# Patient Record
Sex: Male | Born: 1965 | Race: White | Hispanic: No | Marital: Married | Smoking: Current every day smoker
Health system: Southern US, Community
[De-identification: ages and names within clinical notes are randomized; demographics above are authoritative.]

## PROBLEM LIST (undated history)

## (undated) DIAGNOSIS — I10 Essential (primary) hypertension: Secondary | ICD-10-CM

## (undated) DIAGNOSIS — J40 Bronchitis, not specified as acute or chronic: Secondary | ICD-10-CM

## (undated) DIAGNOSIS — G4730 Sleep apnea, unspecified: Secondary | ICD-10-CM

## (undated) HISTORY — PX: CHOLECYSTECTOMY: SHX55

## (undated) HISTORY — PX: NASAL SINUS SURGERY: SHX719

---

## 2011-01-22 ENCOUNTER — Encounter: Payer: Self-pay | Admitting: *Deleted

## 2011-01-22 ENCOUNTER — Emergency Department
Admission: EM | Admit: 2011-01-22 | Discharge: 2011-01-22 | Disposition: A | Discharge status: Home / Self Care | Payer: BC Managed Care – PPO | Source: Home / Self Care | Attending: Family Medicine | Admitting: Family Medicine

## 2011-01-22 ENCOUNTER — Emergency Department
Admit: 2011-01-22 | Discharge: 2011-01-22 | Disposition: A | Discharge status: Home / Self Care | Payer: BC Managed Care – PPO | Attending: Family Medicine | Admitting: Family Medicine

## 2011-01-22 DIAGNOSIS — J209 Acute bronchitis, unspecified: Secondary | ICD-10-CM

## 2011-01-22 DIAGNOSIS — J189 Pneumonia, unspecified organism: Secondary | ICD-10-CM

## 2011-01-22 HISTORY — DX: Essential (primary) hypertension: I10

## 2011-01-22 LAB — POCT INFLUENZA A/B: Influenza B, POC: NEGATIVE

## 2011-01-22 LAB — POCT CBC W AUTO DIFF (K'VILLE URGENT CARE)

## 2011-01-22 MED ORDER — BENZONATATE 200 MG PO CAPS: 200.0000 mg | ORAL_CAPSULE | Freq: Every day | ORAL | Status: AC

## 2011-01-22 MED ORDER — CEFTRIAXONE SODIUM 1 G IJ SOLR
1.0000 g | Freq: Once | INTRAMUSCULAR | Status: AC
  Administered 2011-01-22: 1 g via INTRAMUSCULAR

## 2011-01-22 MED ORDER — LEVOFLOXACIN 500 MG PO TABS: 500.0000 mg | ORAL_TABLET | Freq: Every day | ORAL | Status: AC

## 2011-01-22 NOTE — ED Notes (Signed)
Pt c/o productive cough x 1-2 wks, he was treated by his PCP for bronchitis on 01/18/11 with a zpak. He also c/o fever, body aches, and HA x today. No OTC meds. He did not receive a flu vaccine.

## 2011-01-22 NOTE — ED Provider Notes (Signed)
History     CSN: [id number]  Arrival date & time 01/22/11  1210   First MD Initiated Contact with Patient 01/22/11 1349      Chief Complaint  Patient presents with  . Fever  . Cough  . Generalized Body Aches      HPI Comments: HPI : Patient developed a cold-like illness about two weeks ago and was treated by his PCP for bronchitis with a Z-pack and cough suppressant. Has now developed flu symptoms two days ago with fever to 101.6.   Complains of chills, sweats, myalgias, fatigue, headache. Symptoms are progressively worsening, despite trying OTC fever reducing medicine and rest and fluids.  His cough has become worse.  It is partly productive and worse at night.  Has decreased appetite, but tolerating some liquids by mouth.  He smokes, and has a history of pneumonia in the distant past.  He also has occasional bronchitis.  Review of Systems: Positive for fatigue, mild nasal congestion, mild sore throat, worsening cough. Negative for acute vision changes, stiff neck, focal weakness, syncope, seizures, respiratory distress, vomiting, diarrhea, GU symptoms.   Patient is a 45 y.o. male presenting with fever and cough. The history is provided by the patient.  Fever Primary symptoms of the febrile illness include fever, fatigue, headaches, cough, wheezing, shortness of breath, nausea and myalgias. Primary symptoms do not include visual change, abdominal pain, vomiting, diarrhea, dysuria, arthralgias or rash. The current episode started 2 days ago. This is a new problem.  The headache is not associated with visual change.  Cough Associated symptoms include headaches, myalgias, shortness of breath and wheezing.    Past Medical History  Diagnosis Date  . Hypertension     Past Surgical History  Procedure Date  . Cholecystectomy   . Nasal sinus surgery     Family History  Problem Relation Age of Onset  . Hypertension Mother   . Hypertension Father   . Heart failure Father   .  Cancer Father     kidney  . Hypertension Brother     History  Substance Use Topics  . Smoking status: Current Everyday Smoker -- 0.5 packs/day  . Smokeless tobacco: Not on file  . Alcohol Use: No      Review of Systems  Constitutional: Positive for fever and fatigue.  Respiratory: Positive for cough, shortness of breath and wheezing.   Gastrointestinal: Positive for nausea. Negative for vomiting, abdominal pain and diarrhea.  Genitourinary: Negative for dysuria.  Musculoskeletal: Positive for myalgias. Negative for arthralgias.  Skin: Negative for rash.  Neurological: Positive for headaches.    Allergies  Review of patient's allergies indicates no known allergies.  Home Medications   Current Outpatient Rx  Name Route Sig Dispense Refill  . METOPROLOL SUCCINATE ER 100 MG PO TB24 Oral Take 100 mg by mouth daily.      Marland Kitchen BENZONATATE 200 MG PO CAPS Oral Take 1 capsule (200 mg total) by mouth at bedtime. Take as needed for cough 12 capsule 0  . LEVOFLOXACIN 500 MG PO TABS Oral Take 1 tablet (500 mg total) by mouth daily. 7 tablet 0    BP 123/82  Pulse 88  Temp(Src) 102.2 F (39 C) (Oral)  Resp 18  Ht 6' (1.829 m)  Wt 238 lb 8 oz (108.183 kg)  BMI 32.35 kg/m2  SpO2 96%  Physical Exam Nursing notes and Vital Signs reviewed. Appearance:  Patient appears healthy, stated age, and in no acute distress.  Patient is obese (BMI  32.4)   Eyes:  Pupils are equal, round, and reactive to light and accomodation.  Extraocular movement is intact.  Conjunctivae are not inflamed  Ears:  Canals normal.  Tympanic membranes normal.  Nose:  Mildly congested turbinates.  No sinus tenderness.   Pharynx:  Normal Neck:  Supple.  No adenopathy Lungs:  Faint wheezes left posterior base.  Breath sounds are equal.  Heart:  Regular rate and rhythm without murmurs, rubs, or gallops.  Abdomen:  Nontender without masses or hepatosplenomegaly.  Bowel sounds are present.  No CVA or flank tenderness.    Extremities:  No edema.  No calf tenderness Skin:  No rash present.   ED Course  Procedures  none   Labs Reviewed  POCT INFLUENZA A/B Negative  POCT CBC W AUTO DIFF ([facility]):  CBC:  WBC 16.1; LY 11.4; MO 3.2; GR 85.4; Hgb 13.7    Dg Chest 2 View  01/22/2011  *RADIOLOGY REPORT*  Clinical Data: Cough.  Fever and congestion.  CHEST - 2 VIEW  Comparison: None.  Findings: Heart size is normal.  Mediastinal shadows are normal. There is mild central bronchial thickening but no infiltrate, collapse or effusion.  No significant bony finding.  IMPRESSION: Bronchitis.  No consolidation or collapse.  Original Report Authenticated By: Thomasenia Sales, M.D.     1. Acute bronchitis       MDM  Rocephin 1gm IM.  Tomorrow begin Levaquin 500mg  once daily for one week.  Tessalon at bedtime. Take Mucinex (guaifenesin) twice daily for congestion.  Increase fluid intake, rest. May use Afrin nasal spray (or generic oxymetazoline) twice daily for about 5 days.  Also recommend using saline nasal spray several times daily and/or saline nasal irrigation. Stop all antihistamines for now, and other non-prescription cough/cold preparations. Recommend flu shot when well. Follow-up with family doctor in about one week         Donna Christen, MD 01/22/11 1454

## 2012-02-13 ENCOUNTER — Emergency Department (INDEPENDENT_AMBULATORY_CARE_PROVIDER_SITE_OTHER)
Admission: EM | Admit: 2012-02-13 | Discharge: 2012-02-13 | Disposition: A | Discharge status: Home / Self Care | Payer: BC Managed Care – PPO | Source: Home / Self Care | Attending: Family Medicine | Admitting: Family Medicine

## 2012-02-13 DIAGNOSIS — J9801 Acute bronchospasm: Secondary | ICD-10-CM

## 2012-02-13 DIAGNOSIS — J069 Acute upper respiratory infection, unspecified: Secondary | ICD-10-CM

## 2012-02-13 MED ORDER — PREDNISONE 20 MG PO TABS: 20.0000 mg | ORAL_TABLET | Freq: Two times a day (BID) | ORAL | Status: DC

## 2012-02-13 MED ORDER — ALBUTEROL SULFATE HFA 108 (90 BASE) MCG/ACT IN AERS: 2.0000 | INHALATION_SPRAY | RESPIRATORY_TRACT | Status: DC | PRN

## 2012-02-13 MED ORDER — AZITHROMYCIN 250 MG PO TABS: ORAL_TABLET | ORAL | Status: DC

## 2012-02-13 MED ORDER — BENZONATATE 200 MG PO CAPS: 200.0000 mg | ORAL_CAPSULE | Freq: Every day | ORAL | Status: DC

## 2012-02-13 NOTE — ED Notes (Addendum)
Jeremy Melton complains of cough and wheezing for 3 days. He has congestion and feels tired. He is taking allegra with no relief. Denies fever, chills or sweat. No flu vaccine this season.

## 2012-02-13 NOTE — ED Provider Notes (Signed)
History     CSN: [id number]  Arrival date & time 02/13/12  1755   First MD Initiated Contact with Patient 02/13/12 1801      Chief Complaint  Patient presents with  . Cough    x 3 days  . Wheezing      HPI Comments: Patient complains of sudden onset of sinus congestion and sneezing four days ago.  Yesterday he developed cough, wheezing, shortness of breath with activity, and tightness in his anterior chest.  No fevers, chills, and sweats.  He continues to smoke one pack per day.  The history is provided by the patient.    Past Medical History  Diagnosis Date  . Hypertension     Past Surgical History  Procedure Date  . Cholecystectomy   . Nasal sinus surgery     Family History  Problem Relation Age of Onset  . Hypertension Mother   . Hypertension Father   . Heart failure Father   . Cancer Father     kidney  . Hypertension Brother     History  Substance Use Topics  . Smoking status: Current Every Day Smoker -- 1.0 packs/day for 20 years    Types: Cigarettes  . Smokeless tobacco: Not on file  . Alcohol Use: No      Review of Systems No sore throat + cough No pleuritic pain + wheezing + nasal congestion + post-nasal drainage No sinus pain/pressure No itchy/red eyes No earache No hemoptysis + SOB No fever/chills No nausea No vomiting No abdominal pain No diarrhea No urinary symptoms No skin rashes + fatigue No myalgias No headache Used OTC meds without relief  Allergies  Zyrtec  Home Medications   Current Outpatient Rx  Name  Route  Sig  Dispense  Refill  . METOPROLOL SUCCINATE ER 100 MG PO TB24   Oral   Take 100 mg by mouth daily.           . ALBUTEROL SULFATE HFA 108 (90 BASE) MCG/ACT IN AERS   Inhalation   Inhale 2 puffs into the lungs every 4 (four) hours as needed for wheezing.   1 Inhaler   0   . AZITHROMYCIN 250 MG PO TABS      Take 2 tabs today; then begin one tab once daily for 4 more days.   6 each   0   .  BENZONATATE 200 MG PO CAPS   Oral   Take 1 capsule (200 mg total) by mouth at bedtime. Take as needed for cough   12 capsule   0   . PREDNISONE 20 MG PO TABS   Oral   Take 1 tablet (20 mg total) by mouth 2 (two) times daily.   10 tablet   0     BP 145/93  Pulse 78  Temp 98.4 F (36.9 C) (Oral)  Resp 20  Ht 6' (1.829 m)  Wt 247 lb (112.038 kg)  BMI 33.50 kg/m2  SpO2 94%  Physical Exam Nursing notes and Vital Signs reviewed. Appearance:  Patient appears stated age, and in no acute distress.  Patient is obese (BMI 33.5) Eyes:  Pupils are equal, round, and reactive to light and accomodation.  Extraocular movement is intact.  Conjunctivae are not inflamed  Ears:  Canals normal.  Tympanic membranes normal.  Nose:  Mildly congested turbinates.  No sinus tenderness.   Pharynx:  Normal Neck:  Supple.  No adenopathy Lungs:   Faint diffuse wheezes; no rales or rhonchi.  Breath sounds are equal.  Heart:  Regular rate and rhythm without murmurs, rubs, or gallops.  Abdomen:  Nontender without masses or hepatosplenomegaly.  Bowel sounds are present.  No CVA or flank tenderness.  Extremities:  No edema.  No calf tenderness Skin:  No rash present.   ED Course  Procedures  none      1. Acute upper respiratory infections of unspecified site in a smoker  2. Bronchospasm, acute       MDM   Begin prednisone burst and Z-pack.  Rx for albuterol inhaler.  Prescription written for Benzonatate [facility]) to take at bedtime for night-time cough.  Take plain Mucinex (guaifenesin) twice daily for cough and congestion.  Increase fluid intake, rest. May use Afrin nasal spray (or generic oxymetazoline) twice daily for about 5 days.  Also recommend using saline nasal spray several times daily and saline nasal irrigation (AYR is a common brand) Stop all antihistamines for now, and other non-prescription cough/cold preparations. Follow-up with family doctor if not improving 7 to 10 days.          Lattie Haw, MD 02/17/12 [phone]

## 2013-01-27 ENCOUNTER — Emergency Department
Admission: EM | Admit: 2013-01-27 | Discharge: 2013-01-27 | Disposition: A | Discharge status: Home / Self Care | Payer: BC Managed Care – PPO | Source: Home / Self Care | Attending: Family Medicine | Admitting: Family Medicine

## 2013-01-27 ENCOUNTER — Encounter: Payer: Self-pay | Admitting: Emergency Medicine

## 2013-01-27 DIAGNOSIS — J9801 Acute bronchospasm: Secondary | ICD-10-CM

## 2013-01-27 DIAGNOSIS — J069 Acute upper respiratory infection, unspecified: Secondary | ICD-10-CM

## 2013-01-27 HISTORY — DX: Bronchitis, not specified as acute or chronic: J40

## 2013-01-27 MED ORDER — ALBUTEROL SULFATE HFA 108 (90 BASE) MCG/ACT IN AERS: 2.0000 | INHALATION_SPRAY | RESPIRATORY_TRACT | Status: DC | PRN

## 2013-01-27 MED ORDER — BENZONATATE 200 MG PO CAPS: 200.0000 mg | ORAL_CAPSULE | Freq: Every day | ORAL | Status: DC

## 2013-01-27 MED ORDER — PREDNISONE 20 MG PO TABS: 20.0000 mg | ORAL_TABLET | Freq: Two times a day (BID) | ORAL | Status: DC

## 2013-01-27 MED ORDER — AZITHROMYCIN 250 MG PO TABS: ORAL_TABLET | ORAL | Status: DC

## 2013-01-27 NOTE — ED Provider Notes (Signed)
CSN: [id number]     Arrival date & time 01/27/13  1540 History   First MD Initiated Contact with Patient 01/27/13 1634     Chief Complaint  Patient presents with  . Cough      HPI Comments: Patient complains of onset of sinus congestion, non-productive cough, wheezing, and shortness of breath for 3 days.  No fevers, chills, and sweats.  He continues to smoke.  The history is provided by the patient.    Past Medical History  Diagnosis Date  . Hypertension   . Bronchitis    Past Surgical History  Procedure Laterality Date  . Cholecystectomy    . Nasal sinus surgery     Family History  Problem Relation Age of Onset  . Hypertension Mother   . Hypertension Father   . Heart failure Father   . Cancer Father     kidney  . Hypertension Brother    History  Substance Use Topics  . Smoking status: Current Every Day Smoker -- 1.00 packs/day for 20 years    Types: Cigarettes  . Smokeless tobacco: Not on file  . Alcohol Use: No    Review of Systems + sore throat + cough No pleuritic pain + wheezing + nasal congestion + post-nasal drainage No sinus pain/pressure No itchy/red eyes No earache No hemoptysis + SOB No fever, + chills No nausea No vomiting No abdominal pain No diarrhea No urinary symptoms No skin rash + fatigue No myalgias No headache Used OTC meds without relief  Allergies  Allegra and Zyrtec  Home Medications   Current Outpatient Rx  Name  Route  Sig  Dispense  Refill  . albuterol (PROVENTIL HFA;VENTOLIN HFA) 108 (90 BASE) MCG/ACT inhaler   Inhalation   Inhale 2 puffs into the lungs every 4 (four) hours as needed for wheezing.   1 Inhaler   0   . azithromycin (ZITHROMAX Z-PAK) 250 MG tablet      Take 2 tabs today; then begin one tab once daily for 4 more days.   6 each   0   . benzonatate (TESSALON) 200 MG capsule   Oral   Take 1 capsule (200 mg total) by mouth at bedtime. Take as needed for cough   12 capsule   0   . metoprolol  (TOPROL-XL) 100 MG 24 hr tablet   Oral   Take 100 mg by mouth daily.           . predniSONE (DELTASONE) 20 MG tablet   Oral   Take 1 tablet (20 mg total) by mouth 2 (two) times daily.   10 tablet   0    BP 128/78  Pulse 83  Temp(Src) 98.3 F (36.8 C) (Oral)  Resp 16  Ht 6\' 1"  (1.854 m)  Wt 250 lb (113.399 kg)  BMI 32.99 kg/m2  SpO2 95% Physical Exam Nursing notes and Vital Signs reviewed. Appearance:  Patient appears stated age, and in no acute distress.  Patient is obese (BMI 33.0) Eyes:  Pupils are equal, round, and reactive to light and accomodation.  Extraocular movement is intact.  Conjunctivae are not inflamed  Ears:  Canals normal.  Tympanic membranes normal.  Nose:  Mildly congested turbinates.  No sinus tenderness.     Pharynx:  Normal Neck:  Supple.  Non-tender shotty posterior nodes are palpated bilaterally  Lungs:   Bibasilar wheezes and decreased breath sounds posteriorly.  Breath sounds are equal. No respiratory distress Heart:  Regular rate and rhythm without  murmurs, rubs, or gallops.  Abdomen:  Nontender without masses or hepatosplenomegaly.  Bowel sounds are present.  No CVA or flank tenderness.  Extremities:  No edema.  No calf tenderness Skin:  No rash present.   ED Course  Procedures  none          MDM   1. Acute upper respiratory infections of unspecified site; suspect early viral URI.  History of recurring bronchitis in a smoker  2. Bronchospasm    Begin Z-pack, prednisone burst.  Continue albuterol MDI as needed.  Prescription written for Benzonatate [facility](Tessalon) to take at bedtime for night-time cough.  Take plain Mucinex (1200 mg guaifenesin) twice daily for cough and congestion.  Increase fluid intake, rest. May use Afrin nasal spray (or generic oxymetazoline) twice daily for about 5 days.  Also recommend using saline nasal spray several times daily and saline nasal irrigation (AYR is a common brand) Try warm salt water gargles for sore  throat.  Stop all antihistamines for now, and other non-prescription cough/cold preparations.     Follow-up with family doctor if not improving 7 to 10 days.    Lattie HawStephen A Tanaka Gillen, MD 01/28/13 1005

## 2013-01-27 NOTE — Discharge Instructions (Signed)
Take plain Mucinex (1200 mg guaifenesin) twice daily for cough and congestion.  Increase fluid intake, rest. °May use Afrin nasal spray (or generic oxymetazoline) twice daily for about 5 days.  Also recommend using saline nasal spray several times daily and saline nasal irrigation (AYR is a common brand) °Try warm salt water gargles for sore throat.  °Stop all antihistamines for now, and other non-prescription cough/cold preparations. °  °Follow-up with family doctor if not improving 7 to 10 days.  °

## 2013-01-27 NOTE — ED Notes (Signed)
Has had cough x 3 days and has hx of bronchitis so wanted to be checked.

## 2013-04-18 DIAGNOSIS — I10 Essential (primary) hypertension: Secondary | ICD-10-CM | POA: Insufficient documentation

## 2013-07-01 ENCOUNTER — Emergency Department (INDEPENDENT_AMBULATORY_CARE_PROVIDER_SITE_OTHER): Payer: BC Managed Care – PPO

## 2013-07-01 ENCOUNTER — Emergency Department
Admission: EM | Admit: 2013-07-01 | Discharge: 2013-07-01 | Disposition: A | Discharge status: Home / Self Care | Payer: BC Managed Care – PPO | Source: Home / Self Care | Attending: Family Medicine | Admitting: Family Medicine

## 2013-07-01 ENCOUNTER — Encounter: Payer: Self-pay | Admitting: Emergency Medicine

## 2013-07-01 DIAGNOSIS — S92919A Unspecified fracture of unspecified toe(s), initial encounter for closed fracture: Secondary | ICD-10-CM

## 2013-07-01 DIAGNOSIS — M7730 Calcaneal spur, unspecified foot: Secondary | ICD-10-CM

## 2013-07-01 DIAGNOSIS — Z23 Encounter for immunization: Secondary | ICD-10-CM

## 2013-07-01 DIAGNOSIS — W010XXA Fall on same level from slipping, tripping and stumbling without subsequent striking against object, initial encounter: Secondary | ICD-10-CM

## 2013-07-01 DIAGNOSIS — Z716 Tobacco abuse counseling: Secondary | ICD-10-CM

## 2013-07-01 DIAGNOSIS — S92403A Displaced unspecified fracture of unspecified great toe, initial encounter for closed fracture: Secondary | ICD-10-CM

## 2013-07-01 MED ORDER — TRAMADOL-ACETAMINOPHEN 37.5-325 MG PO TABS: 1.0000 | ORAL_TABLET | Freq: Four times a day (QID) | ORAL | Status: DC | PRN

## 2013-07-01 MED ORDER — TETANUS-DIPHTH-ACELL PERTUSSIS 5-2.5-18.5 LF-MCG/0.5 IM SUSP
0.5000 mL | Freq: Once | INTRAMUSCULAR | Status: AC
  Administered 2013-07-01: 0.5 mL via INTRAMUSCULAR

## 2013-07-01 NOTE — ED Notes (Signed)
Slipped  off step and L great toe went under his foot.  Happened a couple of hrs. Ago. Sustained a bruise and sl. Abrasion on top of toe.  Distal joint painful to move. No Tetanus shot in > 10 yrs.

## 2013-07-01 NOTE — ED Provider Notes (Signed)
CSN: [id number]     Arrival date & time 07/01/13  1444 History   First MD Initiated Contact with Patient 07/01/13 1445     Chief Complaint  Patient presents with  . Toe Injury    HPI  L great toe pain x1 day. Pt was going up the stairs. Fell and twisted L toe. Has had pain, swelling since this point. Able to ambulate.   Past Medical History  Diagnosis Date  . Hypertension   . Bronchitis    Past Surgical History  Procedure Laterality Date  . Cholecystectomy    . Nasal sinus surgery     Family History  Problem Relation Age of Onset  . Hypertension Mother   . Hypertension Father   . Heart failure Father   . Cancer Father     kidney  . Hypertension Brother    History  Substance Use Topics  . Smoking status: Current Every Day Smoker -- 1.00 packs/day for 20 years    Types: Cigarettes  . Smokeless tobacco: Never Used  . Alcohol Use: No    Review of Systems  All other systems reviewed and are negative.   Allergies  Allegra and Zyrtec  Home Medications   Prior to Admission medications   Medication Sig Start Date End Date Taking? Authorizing Provider  albuterol (PROVENTIL HFA;VENTOLIN HFA) 108 (90 BASE) MCG/ACT inhaler Inhale 2 puffs into the lungs every 4 (four) hours as needed for wheezing. 01/27/13 01/27/14  Lattie HawStephen A Beese, MD  azithromycin (ZITHROMAX Z-PAK) 250 MG tablet Take 2 tabs today; then begin one tab once daily for 4 more days. 01/27/13   Lattie HawStephen A Beese, MD  benzonatate (TESSALON) 200 MG capsule Take 1 capsule (200 mg total) by mouth at bedtime. Take as needed for cough 01/27/13   Lattie HawStephen A Beese, MD  metoprolol (TOPROL-XL) 100 MG 24 hr tablet Take 100 mg by mouth daily.      Historical Provider, MD  predniSONE (DELTASONE) 20 MG tablet Take 1 tablet (20 mg total) by mouth 2 (two) times daily. 01/27/13   Lattie HawStephen A Beese, MD   BP 127/84  Pulse 70  Temp(Src) 98.1 F (36.7 C) (Oral)  Ht 6' (1.829 m)  Wt 250 lb (113.399 kg)  BMI 33.90 kg/m2  SpO2 97% Physical Exam    Constitutional: He is oriented to person, place, and time. He appears well-developed and well-nourished.  HENT:  Head: Normocephalic and atraumatic.  Eyes: Conjunctivae are normal. Pupils are equal, round, and reactive to light.  Neck: Normal range of motion. Neck supple.  Cardiovascular: Normal rate and regular rhythm.   Pulmonary/Chest: Effort normal and breath sounds normal.  Abdominal: Soft.  Musculoskeletal: Normal range of motion.       Feet:  Neurological: He is alert and oriented to person, place, and time.    ED Course  Procedures (including critical care time) Labs Review Labs Reviewed - No data to display  Imaging Review Dg Foot Complete Left  07/01/2013   CLINICAL DATA:  Fall.  Left foot pain.  Great toe pain.  EXAM: LEFT FOOT - COMPLETE 3+ VIEW  COMPARISON:  None.  FINDINGS: Nondisplaced fracture is seen involving the distal phalanx of the great toe. No evidence of dislocation. No other fractures are identified. Small plantar and dorsal calcaneal bone spurs noted.  IMPRESSION: Nondisplaced fracture of the distal phalanx of the great toe   Electronically Signed   By: Myles RosenthalJohn  Stahl M.D.   On: 07/01/2013 15:35  MDM   1. Fractured great toe    Will place on post op shoe and buddy tape  tdap given  Ultracet for pain  Discussed MSK red flags  Follow up with sports medicine.     The patient and/or caregiver has been counseled thoroughly with regard to treatment plan and/or medications prescribed including dosage, schedule, interactions, rationale for use, and possible side effects and they verbalize understanding. Diagnoses and expected course of recovery discussed and will return if not improved as expected or if the condition worsens. Patient and/or caregiver verbalized understanding.           Doree Albee, MD 07/01/13 [phone]

## 2013-07-04 ENCOUNTER — Telehealth: Payer: Self-pay | Admitting: *Deleted

## 2013-07-10 ENCOUNTER — Ambulatory Visit (INDEPENDENT_AMBULATORY_CARE_PROVIDER_SITE_OTHER): Payer: BC Managed Care – PPO | Admitting: Sports Medicine

## 2013-07-10 ENCOUNTER — Encounter: Payer: Self-pay | Admitting: Sports Medicine

## 2013-07-10 VITALS — BP 134/89 | HR 72 | Ht 73.0 in | Wt 248.0 lb

## 2013-07-10 DIAGNOSIS — S92919A Unspecified fracture of unspecified toe(s), initial encounter for closed fracture: Secondary | ICD-10-CM

## 2013-07-10 DIAGNOSIS — S92402A Displaced unspecified fracture of left great toe, initial encounter for closed fracture: Secondary | ICD-10-CM | POA: Insufficient documentation

## 2013-07-10 MED ORDER — HYDROCODONE-ACETAMINOPHEN 5-325 MG PO TABS: 1.0000 | ORAL_TABLET | Freq: Three times a day (TID) | ORAL | Status: DC | PRN

## 2013-07-10 NOTE — Progress Notes (Signed)
   Subjective:    I'm seeing this patient as a consultation for:  Dr. Alvester MorinNewton  CC: Toe fracture  HPI: This pleasant 48 year-old male slipped and fell on his left great toe, he had immediate pain, swelling, bruising, was seen in urgent care where x-rays showed a fracture, transverse to the base of the distal phalanx. Pain is moderate, persistent. He is in a postop boot.  Past medical history, Surgical history, Family history not pertinant except as noted below, Social history, Allergies, and medications have been entered into the medical record, reviewed, and no changes needed.   Review of Systems: No headache, visual changes, nausea, vomiting, diarrhea, constipation, dizziness, abdominal pain, skin rash, fevers, chills, night sweats, weight loss, swollen lymph nodes, body aches, joint swelling, muscle aches, chest pain, shortness of breath, mood changes, visual or auditory hallucinations.   Objective:   General: Well Developed, well nourished, and in no acute distress.  Neuro/Psych: Alert and oriented x3, extra-ocular muscles intact, able to move all 4 extremities, sensation grossly intact. Skin: Warm and dry, no rashes noted.  Respiratory: Not using accessory muscles, speaking in full sentences, trachea midline.  Cardiovascular: Pulses palpable, no extremity edema. Abdomen: Does not appear distended. Left foot: Swollen, bruised, tender to palpation over the distal first phalanx. Neurovascularly intact distally.  X-rays reviewed and short transverse fracture of the base of the first distal phalanx  Impression and Recommendations:   This case required medical decision making of moderate complexity.

## 2013-07-10 NOTE — Assessment & Plan Note (Signed)
Closed fracture, transverse to the base of the distal phalanx. Buddy tape, continue postop shoe, hydrocodone for pain, return in 3 weeks, x-ray before visit.  I billed a fracture code for this visit, all subsequent visits for this complaint will be "post-op checks" in the global period.

## 2013-07-31 ENCOUNTER — Encounter: Payer: Self-pay | Admitting: Sports Medicine

## 2013-07-31 ENCOUNTER — Ambulatory Visit (INDEPENDENT_AMBULATORY_CARE_PROVIDER_SITE_OTHER): Payer: BC Managed Care – PPO | Admitting: Sports Medicine

## 2013-07-31 ENCOUNTER — Ambulatory Visit (INDEPENDENT_AMBULATORY_CARE_PROVIDER_SITE_OTHER): Payer: BC Managed Care – PPO

## 2013-07-31 VITALS — BP 122/78 | HR 70 | Ht 73.0 in | Wt 256.0 lb

## 2013-07-31 DIAGNOSIS — S92402D Displaced unspecified fracture of left great toe, subsequent encounter for fracture with routine healing: Secondary | ICD-10-CM

## 2013-07-31 DIAGNOSIS — IMO0001 Reserved for inherently not codable concepts without codable children: Secondary | ICD-10-CM

## 2013-07-31 DIAGNOSIS — S92402A Displaced unspecified fracture of left great toe, initial encounter for closed fracture: Secondary | ICD-10-CM

## 2013-07-31 NOTE — Progress Notes (Signed)
  Subjective: 3 weeks post transverse fracture of the left great distal phalanx, improving significantly.   Objective: General: Well-developed, well-nourished, and in no acute distress. Left Foot: No visible erythema or swelling. Range of motion is full in all directions. Strength is 5/5 in all directions. No hallux valgus. No pes cavus or pes planus. No abnormal callus noted. No pain over the navicular prominence, or base of fifth metatarsal. No tenderness to palpation of the calcaneal insertion of plantar fascia. No pain at the Achilles insertion. No pain over the calcaneal bursa. No pain of the retrocalcaneal bursa. Only minimal tenderness to palpation over the distal phalanx. No hallux rigidus or limitus. No tenderness palpation over interphalangeal joints. No pain with compression of the metatarsal heads. Neurovascularly intact distally.  X-rays reviewed and show healing of the transverse fracture.  Assessment/plan:

## 2013-07-31 NOTE — Assessment & Plan Note (Signed)
Transition into a regular shoe, return in 3 weeks, we will likely turn him loose after that.

## 2013-08-13 ENCOUNTER — Emergency Department
Admission: EM | Admit: 2013-08-13 | Discharge: 2013-08-13 | Disposition: A | Discharge status: Home / Self Care | Payer: BC Managed Care – PPO | Source: Home / Self Care

## 2013-08-13 ENCOUNTER — Encounter: Payer: Self-pay | Admitting: Emergency Medicine

## 2013-08-13 DIAGNOSIS — J209 Acute bronchitis, unspecified: Secondary | ICD-10-CM

## 2013-08-13 DIAGNOSIS — J0100 Acute maxillary sinusitis, unspecified: Secondary | ICD-10-CM

## 2013-08-13 MED ORDER — AZITHROMYCIN 250 MG PO TABS: ORAL_TABLET | ORAL | Status: DC

## 2013-08-13 MED ORDER — HYDROCODONE-HOMATROPINE 5-1.5 MG/5ML PO SYRP: ORAL_SOLUTION | ORAL | Status: DC

## 2013-08-13 NOTE — ED Notes (Signed)
Dry cough, sinus drainage x 1 month

## 2013-08-13 NOTE — Discharge Instructions (Signed)
STart zpak.  Hycodan for cough as needed at bedtime.    Acute Bronchitis Bronchitis is inflammation of the airways that extend from the windpipe into the lungs (bronchi). The inflammation often causes mucus to develop. This leads to a cough, which is the most common symptom of bronchitis.  In acute bronchitis, the condition usually develops suddenly and goes away over time, usually in a couple weeks. Smoking, allergies, and asthma can make bronchitis worse. Repeated episodes of bronchitis may cause further lung problems.  CAUSES Acute bronchitis is most often caused by the same virus that causes a cold. The virus can spread from person to person (contagious).  SIGNS AND SYMPTOMS   Cough.   Fever.   Coughing up mucus.   Body aches.   Chest congestion.   Chills.   Shortness of breath.   Sore throat.  DIAGNOSIS  Acute bronchitis is usually diagnosed through a physical exam. Tests, such as chest X-rays, are sometimes done to rule out other conditions.  TREATMENT  Acute bronchitis usually goes away in a couple weeks. Often times, no medical treatment is necessary. Medicines are sometimes given for relief of fever or cough. Antibiotics are usually not needed but may be prescribed in certain situations. In some cases, an inhaler may be recommended to help reduce shortness of breath and control the cough. A cool mist vaporizer may also be used to help thin bronchial secretions and make it easier to clear the chest.  HOME CARE INSTRUCTIONS  Get plenty of rest.   Drink enough fluids to keep your urine clear or pale yellow (unless you have a medical condition that requires fluid restriction). Increasing fluids may help thin your secretions and will prevent dehydration.   Only take over-the-counter or prescription medicines as directed by your health care provider.   Avoid smoking and secondhand smoke. Exposure to cigarette smoke or irritating chemicals will make bronchitis  worse. If you are a smoker, consider using nicotine gum or skin patches to help control withdrawal symptoms. Quitting smoking will help your lungs heal faster.   Reduce the chances of another bout of acute bronchitis by washing your hands frequently, avoiding people with cold symptoms, and trying not to touch your hands to your mouth, nose, or eyes.   Follow up with your health care provider as directed.  SEEK MEDICAL CARE IF: Your symptoms do not improve after 1 week of treatment.  SEEK IMMEDIATE MEDICAL CARE IF:  You develop an increased fever or chills.   You have chest pain.   You have severe shortness of breath.  You have bloody sputum.   You develop dehydration.  You develop fainting.  You develop repeated vomiting.  You develop a severe headache. MAKE SURE YOU:   Understand these instructions.  Will watch your condition.  Will get help right away if you are not doing well or get worse. Document Released: 02/19/2004 Document Revised: 09/13/2012 Document Reviewed: 07/04/2012 [facility] Patient Information 2015 [place], [place]. This information is not intended to replace advice given to you by your health care provider. Make sure you discuss any questions you have with your health care provider. Sinusitis Sinusitis is redness, soreness, and swelling (inflammation) of the paranasal sinuses. Paranasal sinuses are air pockets within the bones of your face (beneath the eyes, the middle of the forehead, or above the eyes). In healthy paranasal sinuses, mucus is able to drain out, and air is able to circulate through them by way of your nose. However, when your paranasal  sinuses are inflamed, mucus and air can become trapped. This can allow bacteria and other germs to grow and cause infection. Sinusitis can develop quickly and last only a short time (acute) or continue over a long period (chronic). Sinusitis that lasts for more than 12 weeks is considered chronic.  CAUSES    Causes of sinusitis include:  Allergies.  Structural abnormalities, such as displacement of the cartilage that separates your nostrils (deviated septum), which can decrease the air flow through your nose and sinuses and affect sinus drainage.  Functional abnormalities, such as when the small hairs (cilia) that line your sinuses and help remove mucus do not work properly or are not present. SYMPTOMS  Symptoms of acute and chronic sinusitis are the same. The primary symptoms are pain and pressure around the affected sinuses. Other symptoms include:  Upper toothache.  Earache.  Headache.  Bad breath.  Decreased sense of smell and taste.  A cough, which worsens when you are lying flat.  Fatigue.  Fever.  Thick drainage from your nose, which often is green and may contain pus (purulent).  Swelling and warmth over the affected sinuses. DIAGNOSIS  Your caregiver will perform a physical exam. During the exam, your caregiver may:  Look in your nose for signs of abnormal growths in your nostrils (nasal polyps).  Tap over the affected sinus to check for signs of infection.  View the inside of your sinuses (endoscopy) with a special imaging device with a light attached (endoscope), which is inserted into your sinuses. If your caregiver suspects that you have chronic sinusitis, one or more of the following tests may be recommended:  Allergy tests.  Nasal culture--A sample of mucus is taken from your nose and sent to a lab and screened for bacteria.  Nasal cytology--A sample of mucus is taken from your nose and examined by your caregiver to determine if your sinusitis is related to an allergy. TREATMENT  Most cases of acute sinusitis are related to a viral infection and will resolve on their own within 10 days. Sometimes medicines are prescribed to help relieve symptoms (pain medicine, decongestants, nasal steroid sprays, or saline sprays).  However, for sinusitis related to a  bacterial infection, your caregiver will prescribe antibiotic medicines. These are medicines that will help kill the bacteria causing the infection.  Rarely, sinusitis is caused by a fungal infection. In theses cases, your caregiver will prescribe antifungal medicine. For some cases of chronic sinusitis, surgery is needed. Generally, these are cases in which sinusitis recurs more than 3 times per year, despite other treatments. HOME CARE INSTRUCTIONS   Drink plenty of water. Water helps thin the mucus so your sinuses can drain more easily.  Use a humidifier.  Inhale steam 3 to 4 times a day (for example, sit in the bathroom with the shower running).  Apply a warm, moist washcloth to your face 3 to 4 times a day, or as directed by your caregiver.  Use saline nasal sprays to help moisten and clean your sinuses.  Take over-the-counter or prescription medicines for pain, discomfort, or fever only as directed by your caregiver. SEEK IMMEDIATE MEDICAL CARE IF:  You have increasing pain or severe headaches.  You have nausea, vomiting, or drowsiness.  You have swelling around your face.  You have vision problems.  You have a stiff neck.  You have difficulty breathing. MAKE SURE YOU:   Understand these instructions.  Will watch your condition.  Will get help right away if you are  not doing well or get worse. Document Released: 01/11/2005 Document Revised: 04/05/2011 Document Reviewed: 01/26/2011 [facility] Patient Information 2015 [place]. This information is not intended to replace advice given to you by your health care provider. Make sure you discuss any questions you have with your health care provider.

## 2013-08-13 NOTE — ED Provider Notes (Signed)
CSN: [id number]     Arrival date & time 08/13/13  1359 History   None    Chief Complaint  Patient presents with  . Cough   (Consider location/radiation/quality/duration/timing/severity/associated sxs/prior Treatment) HPI Pt is a 48 yo male who presents to the clinic with one month of dry and productive cough, sinus pressure and drainage. Previous smoker but quit a few months ago. Denies any fever, chills, n/v/d. Cough is worse at night. Not tried anything OtC.   Past Medical History  Diagnosis Date  . Hypertension   . Bronchitis    Past Surgical History  Procedure Laterality Date  . Cholecystectomy    . Nasal sinus surgery     Family History  Problem Relation Age of Onset  . Hypertension Mother   . Hypertension Father   . Heart failure Father   . Cancer Father     kidney  . Hypertension Brother    History  Substance Use Topics  . Smoking status: Current Every Day Smoker -- 1.00 packs/day for 20 years    Types: Cigarettes  . Smokeless tobacco: Never Used  . Alcohol Use: No    Review of Systems  All other systems reviewed and are negative.   Allergies  Allegra and Zyrtec  Home Medications   Prior to Admission medications   Medication Sig Start Date End Date Taking? Authorizing Provider  albuterol (PROVENTIL HFA;VENTOLIN HFA) 108 (90 BASE) MCG/ACT inhaler Inhale 2 puffs into the lungs every 4 (four) hours as needed for wheezing. 01/27/13 01/27/14  Lattie Haw, MD  azithromycin (ZITHROMAX) 250 MG tablet Take 2 tablets now and then one tablet for 4 days. 08/13/13   Krayton Wortley L Vernon Maish, PA-C  HYDROcodone-acetaminophen (NORCO/VICODIN) 5-325 MG per tablet Take 1 tablet by mouth every 8 (eight) hours as needed for moderate pain. 07/10/13   Monica Becton, MD  HYDROcodone-homatropine [facility]) 5-1.5 MG/5ML syrup Take 5mL every 12 hours and at bedtime as needed for cough. 08/13/13   Goro Wenrick L Lydiann Bonifas, PA-C  metoprolol (TOPROL-XL) 100 MG 24 hr tablet Take 100 mg by mouth daily.       Historical Provider, MD  traMADol-acetaminophen (ULTRACET) 37.5-325 MG per tablet Take 1 tablet by mouth every 6 (six) hours as needed. 07/01/13   Doree Albee, MD   BP 129/86  Pulse 68  Temp(Src) 98.3 F (36.8 C) (Oral)  Ht 6' (1.829 m)  Wt 257 lb (116.574 kg)  BMI 34.85 kg/m2  SpO2 96% Physical Exam  Constitutional: He is oriented to person, place, and time. He appears well-developed and well-nourished.  HENT:  Head: Normocephalic and atraumatic.  Right Ear: External ear normal.  Left Ear: External ear normal.  TM's clear bilaterally.  Maxillary sinus tenderness to palpation bilaterally.  Nasal turbinates red and swollen bilaterally.   Eyes: Conjunctivae are normal. Right eye exhibits no discharge. Left eye exhibits no discharge.  Neck: Normal range of motion. Neck supple.  Cardiovascular: Normal rate, regular rhythm and normal heart sounds.   Pulmonary/Chest: Effort normal and breath sounds normal. He has no wheezes.  Productive cough with deep breaths. Coarse breath sounds.   Lymphadenopathy:    He has no cervical adenopathy.  Neurological: He is alert and oriented to person, place, and time.  Skin: Skin is dry.  Psychiatric: He has a normal mood and affect. His behavior is normal.    ED Course  Procedures (including critical care time) Labs Review Labs Reviewed - No data to display  Imaging Review No results found.  MDM   1. Acute bronchitis, unspecified organism   2. Acute maxillary sinusitis, recurrence not specified    Treated with zpak and hycodan for cough.  Discussed symptomatic treatment and gave HO.  Consider flonase for nasal congestion 2 sprays each nostril daily.  Encouraged pt not to start back smoking.  Call if symptoms changing or not improving in next 3 days.     Jomarie LongsJade L Keiffer Piper, PA-C 08/13/13 1457

## 2013-08-14 NOTE — ED Provider Notes (Signed)
Agree with exam, assessment, and plan.   Lattie HawStephen A Beese, MD 08/14/13 [phone]

## 2013-08-21 ENCOUNTER — Ambulatory Visit (INDEPENDENT_AMBULATORY_CARE_PROVIDER_SITE_OTHER): Payer: BC Managed Care – PPO

## 2013-08-21 ENCOUNTER — Encounter: Payer: Self-pay | Admitting: Sports Medicine

## 2013-08-21 ENCOUNTER — Ambulatory Visit (INDEPENDENT_AMBULATORY_CARE_PROVIDER_SITE_OTHER): Payer: BC Managed Care – PPO | Admitting: Sports Medicine

## 2013-08-21 VITALS — BP 138/88 | HR 82 | Ht 73.0 in | Wt 260.0 lb

## 2013-08-21 DIAGNOSIS — M79609 Pain in unspecified limb: Secondary | ICD-10-CM

## 2013-08-21 DIAGNOSIS — M79671 Pain in right foot: Secondary | ICD-10-CM

## 2013-08-21 DIAGNOSIS — M7730 Calcaneal spur, unspecified foot: Secondary | ICD-10-CM

## 2013-08-21 DIAGNOSIS — S92402D Displaced unspecified fracture of left great toe, subsequent encounter for fracture with routine healing: Secondary | ICD-10-CM

## 2013-08-21 NOTE — Assessment & Plan Note (Signed)
This likely represents strain of the plantar fascia in the mid arch, he does have pes cavus. Considering an old fracture in the distant past we are going to obtain x-rays to further evaluate for any concurrent degenerative changes. Return to see me for orthotics as needed.

## 2013-08-21 NOTE — Assessment & Plan Note (Signed)
Clinically resolved, return as needed. 

## 2013-08-21 NOTE — Progress Notes (Signed)
  Subjective:    CC: Followup  HPI: Left great toe fracture: Clinically resolved after 7 weeks.  Right arch pain: Moderate, persistent, localized in the right arch, worse with weightbearing, has never had orthotics, doesn't use any NSAIDs. No recent trauma.  Past medical history, Surgical history, Family history not pertinant except as noted below, Social history, Allergies, and medications have been entered into the medical record, reviewed, and no changes needed.   Review of Systems: No fevers, chills, night sweats, weight loss, chest pain, or shortness of breath.   Objective:    General: Well Developed, well nourished, and in no acute distress.  Neuro: Alert and oriented x3, extra-ocular muscles intact, sensation grossly intact.  HEENT: Normocephalic, atraumatic, pupils equal round reactive to light, neck supple, no masses, no lymphadenopathy, thyroid nonpalpable.  Skin: Warm and dry, no rashes. Cardiac: Regular rate and rhythm, no murmurs rubs or gallops, no lower extremity edema.  Respiratory: Clear to auscultation bilaterally. Not using accessory muscles, speaking in full sentences. Right Foot: No visible erythema or swelling. Range of motion is full in all directions. Strength is 5/5 in all directions. No hallux valgus. Bilateral pes cavus No abnormal callus noted. No pain over the navicular prominence, or base of fifth metatarsal. No tenderness to palpation of the calcaneal insertion of plantar fascia. No pain at the Achilles insertion. No pain over the calcaneal bursa. No pain of the retrocalcaneal bursa. No tenderness to palpation over the tarsals, metatarsals, or phalanges. No hallux rigidus or limitus. No tenderness palpation over interphalangeal joints. No pain with compression of the metatarsal heads. Neurovascularly intact distally. Left Foot: No visible erythema or swelling. Range of motion is full in all directions. Strength is 5/5 in all directions. No  hallux valgus. No pes cavus or pes planus. No abnormal callus noted. No pain over the navicular prominence, or base of fifth metatarsal. No tenderness to palpation of the calcaneal insertion of plantar fascia. No pain at the Achilles insertion. No pain over the calcaneal bursa. No pain of the retrocalcaneal bursa. No tenderness to palpation over the tarsals, metatarsals, or phalanges. No tenderness to palpation over the fracture line No hallux rigidus or limitus. No tenderness palpation over interphalangeal joints. No pain with compression of the metatarsal heads. Neurovascularly intact distally.  Impression and Recommendations:

## 2013-09-06 DIAGNOSIS — G4730 Sleep apnea, unspecified: Secondary | ICD-10-CM | POA: Insufficient documentation

## 2013-11-16 ENCOUNTER — Ambulatory Visit (INDEPENDENT_AMBULATORY_CARE_PROVIDER_SITE_OTHER): Payer: BC Managed Care – PPO | Admitting: Sports Medicine

## 2013-11-16 ENCOUNTER — Encounter: Payer: Self-pay | Admitting: Sports Medicine

## 2013-11-16 VITALS — BP 119/77 | HR 75 | Ht 73.0 in | Wt 264.0 lb

## 2013-11-16 DIAGNOSIS — M79671 Pain in right foot: Secondary | ICD-10-CM

## 2013-11-16 NOTE — Addendum Note (Signed)
Addended by: Monica BectonHEKKEKANDAM, THOMAS J on: 11/16/2013 03:32 PM   Modules accepted: Level of Service

## 2013-11-16 NOTE — Progress Notes (Signed)

## 2013-11-16 NOTE — Assessment & Plan Note (Signed)
With bilateral pes cavus. Custom orthotics as above. Return in one month.

## 2013-12-14 ENCOUNTER — Ambulatory Visit (INDEPENDENT_AMBULATORY_CARE_PROVIDER_SITE_OTHER): Payer: BC Managed Care – PPO | Admitting: Sports Medicine

## 2013-12-14 ENCOUNTER — Encounter: Payer: Self-pay | Admitting: Sports Medicine

## 2013-12-14 VITALS — BP 132/88 | HR 79 | Ht 73.0 in | Wt 265.0 lb

## 2013-12-14 DIAGNOSIS — M79671 Pain in right foot: Secondary | ICD-10-CM | POA: Diagnosis not present

## 2013-12-14 NOTE — Assessment & Plan Note (Signed)
Symptoms have completely resolved with custom orthotics. Return as needed.

## 2013-12-14 NOTE — Progress Notes (Signed)
  Subjective:    CC: Follow-up  HPI: Heart pain: Resolved with custom orthotics.  Past medical history, Surgical history, Family history not pertinant except as noted below, Social history, Allergies, and medications have been entered into the medical record, reviewed, and no changes needed.   Review of Systems: No fevers, chills, night sweats, weight loss, chest pain, or shortness of breath.   Objective:    General: Well Developed, well nourished, and in no acute distress.  Neuro: Alert and oriented x3, extra-ocular muscles intact, sensation grossly intact.  HEENT: Normocephalic, atraumatic, pupils equal round reactive to light, neck supple, no masses, no lymphadenopathy, thyroid nonpalpable.  Skin: Warm and dry, no rashes. Cardiac: Regular rate and rhythm, no murmurs rubs or gallops, no lower extremity edema.  Respiratory: Clear to auscultation bilaterally. Not using accessory muscles, speaking in full sentences. Right Foot: No visible erythema or swelling. Range of motion is full in all directions. Strength is 5/5 in all directions. No hallux valgus. Bilateral pes cavus No abnormal callus noted. No pain over the navicular prominence, or base of fifth metatarsal. No tenderness to palpation of the calcaneal insertion of plantar fascia. No pain at the Achilles insertion. No pain over the calcaneal bursa. No pain of the retrocalcaneal bursa. No tenderness to palpation over the tarsals, metatarsals, or phalanges. No hallux rigidus or limitus. No tenderness palpation over interphalangeal joints. No pain with compression of the metatarsal heads. Neurovascularly intact distally. Left Foot: No visible erythema or swelling. Range of motion is full in all directions. Strength is 5/5 in all directions. No hallux valgus. No pes cavus or pes planus. No abnormal callus noted. No pain over the navicular prominence, or base of fifth metatarsal. No tenderness to palpation of the  calcaneal insertion of plantar fascia. No pain at the Achilles insertion. No pain over the calcaneal bursa. No pain of the retrocalcaneal bursa. No tenderness to palpation over the tarsals, metatarsals, or phalanges. No tenderness to palpation over the fracture line No hallux rigidus or limitus. No tenderness palpation over interphalangeal joints. No pain with compression of the metatarsal heads. Neurovascularly intact distally.  Impression and Recommendations:

## 2014-11-20 IMAGING — CR DG FOOT COMPLETE 3+V*L*
3 series · 3 of 3 positions shown · non-contrast
Comparison: None.

CLINICAL DATA: Fall.  Left foot pain.  Great toe pain.

EXAM:
LEFT FOOT - COMPLETE 3+ VIEW

[view not recorded (1 of 3)]
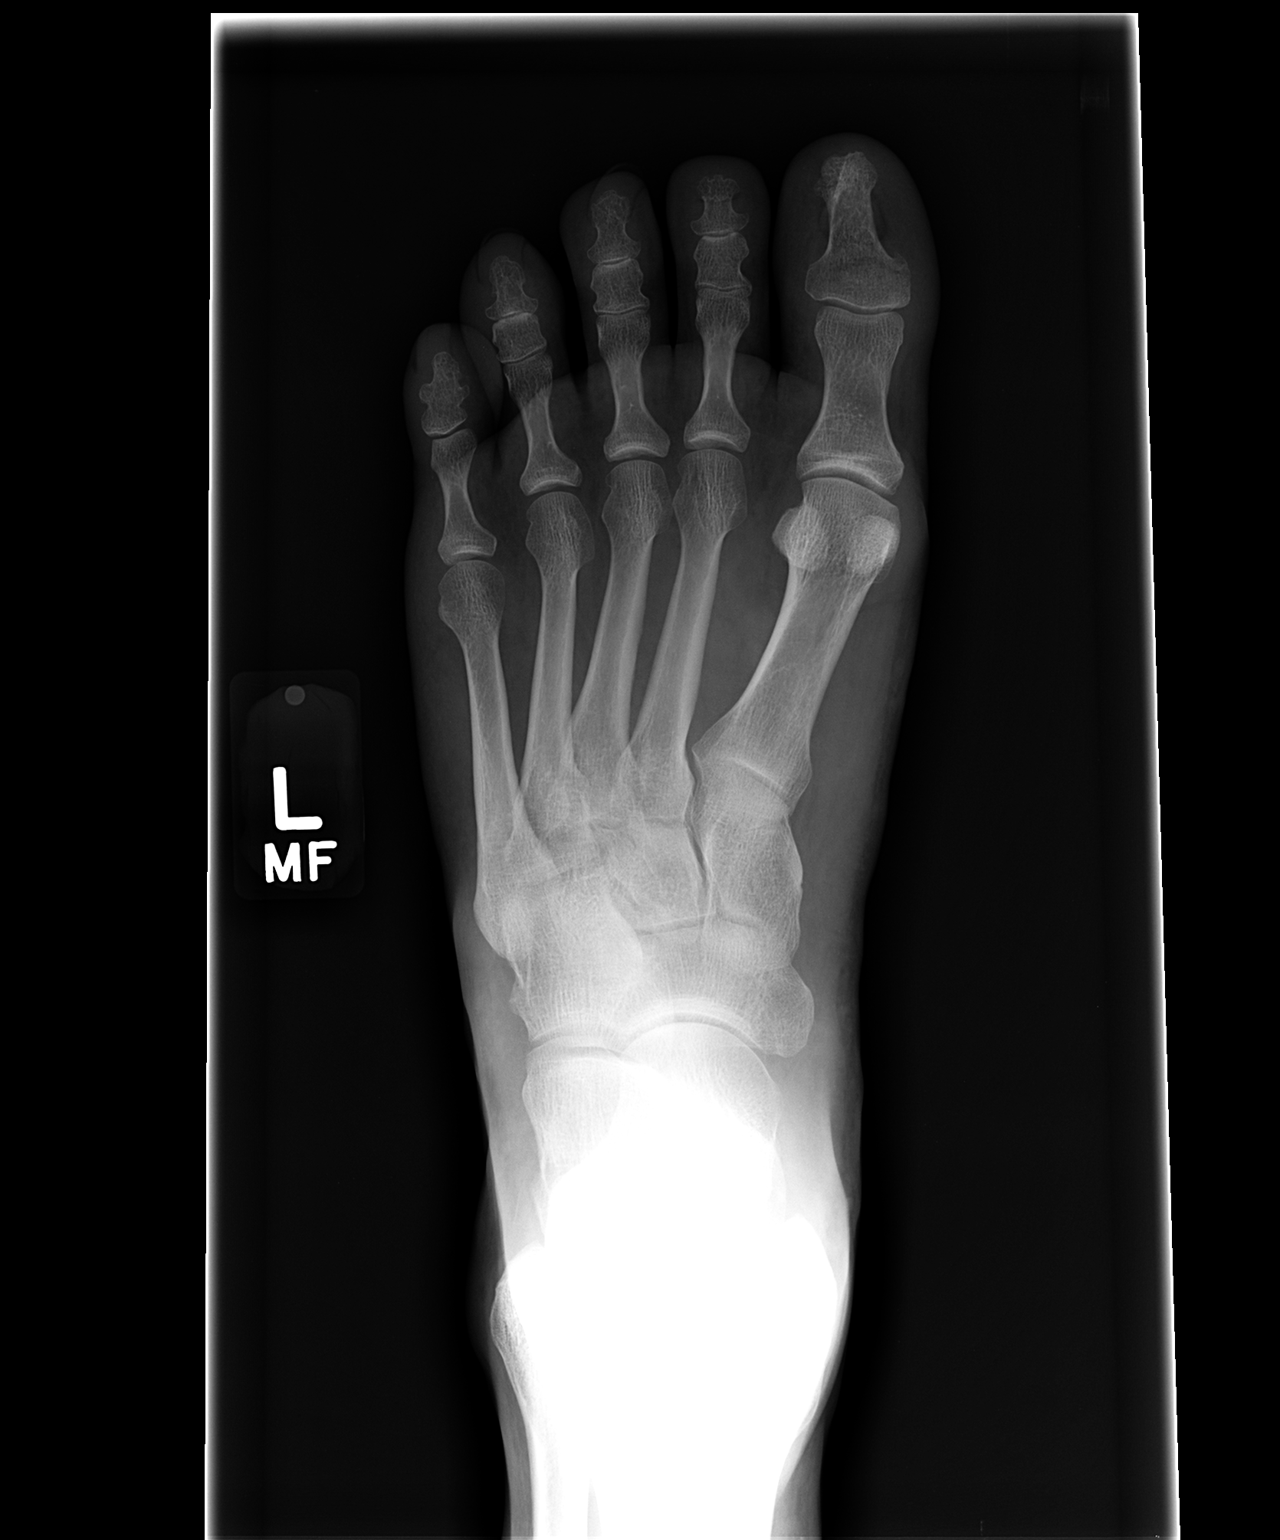

[view not recorded (2 of 3)]
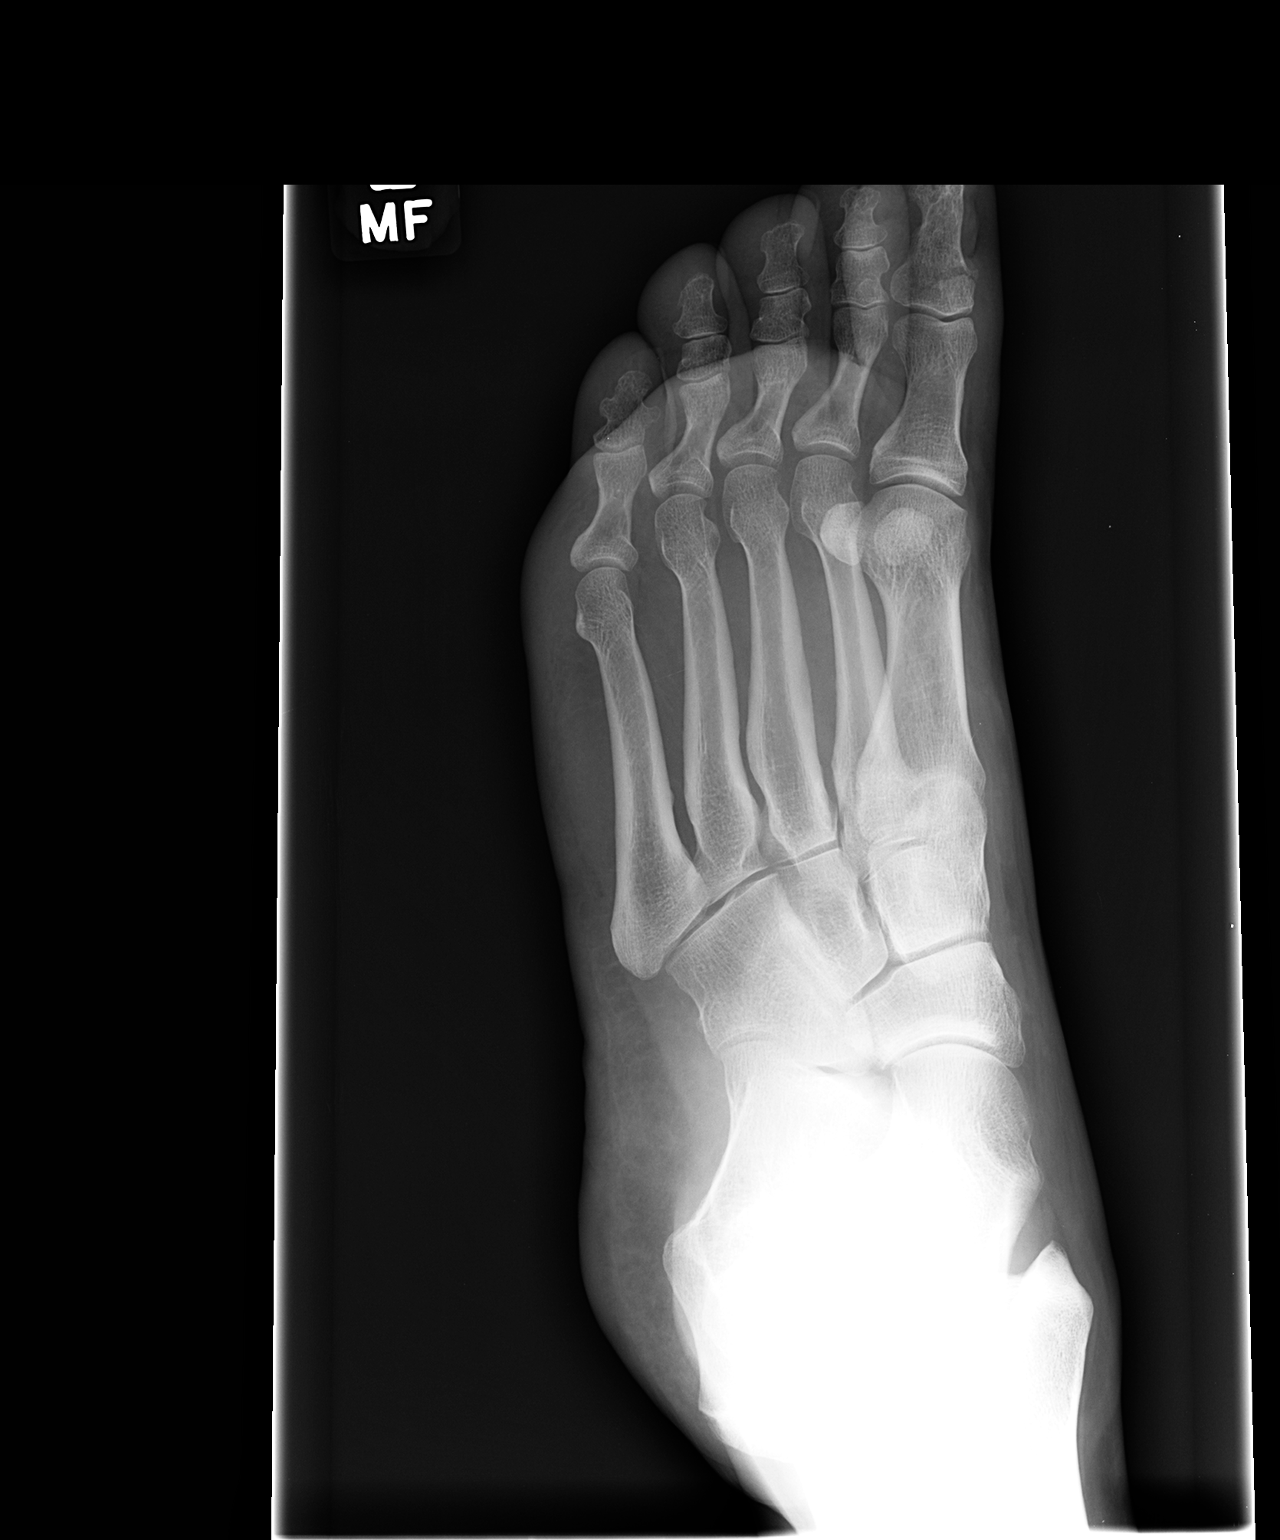

[view not recorded (3 of 3)]
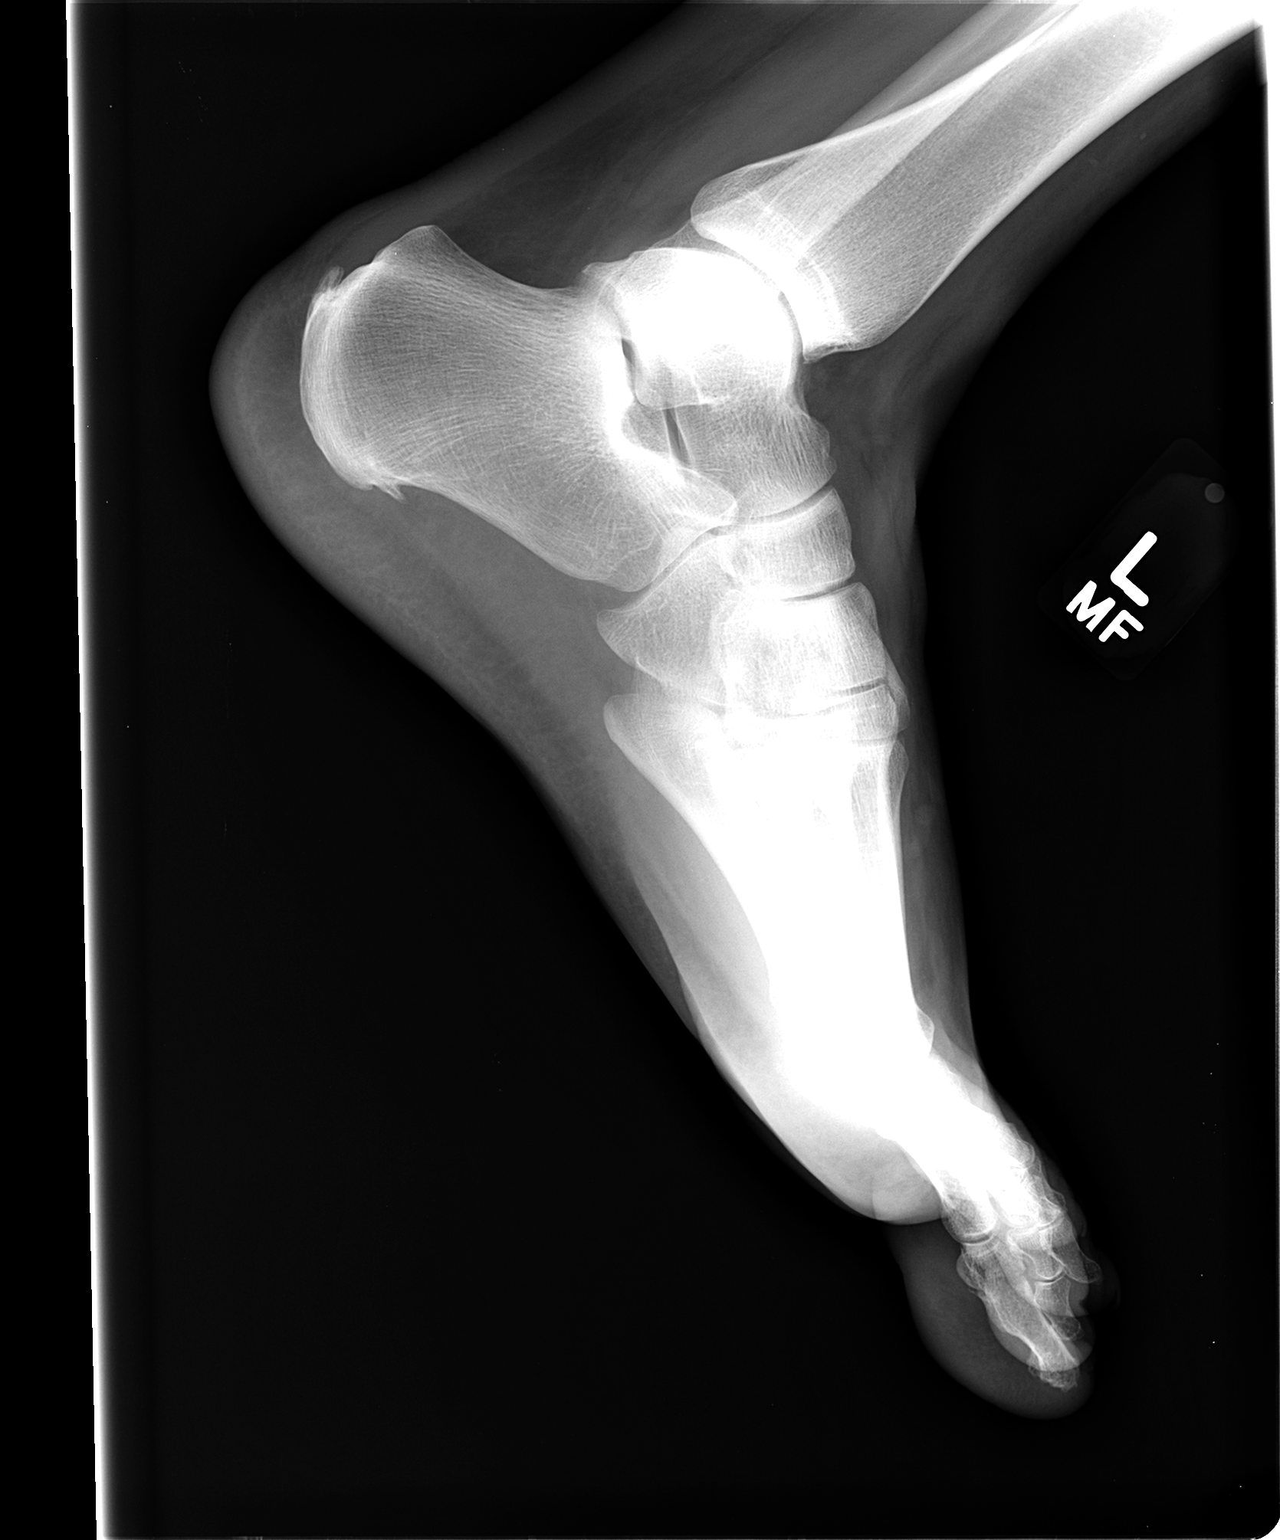

[3 of 3 positions shown; findings below may reference images not displayed]

FINDINGS: Nondisplaced fracture is seen involving the distal phalanx of the
great toe. No evidence of dislocation. No other fractures are
identified. Small plantar and dorsal calcaneal bone spurs noted.
IMPRESSION: Nondisplaced fracture of the distal phalanx of the great toe

## 2015-06-11 DIAGNOSIS — F17210 Nicotine dependence, cigarettes, uncomplicated: Secondary | ICD-10-CM | POA: Insufficient documentation

## 2015-06-11 DIAGNOSIS — G4700 Insomnia, unspecified: Secondary | ICD-10-CM | POA: Insufficient documentation

## 2016-04-09 ENCOUNTER — Emergency Department
Admission: EM | Admit: 2016-04-09 | Discharge: 2016-04-09 | Disposition: A | Discharge status: Home / Self Care | Payer: BLUE CROSS/BLUE SHIELD | Source: Home / Self Care | Attending: Family Medicine | Admitting: Family Medicine

## 2016-04-09 ENCOUNTER — Encounter: Payer: Self-pay | Admitting: Emergency Medicine

## 2016-04-09 DIAGNOSIS — J069 Acute upper respiratory infection, unspecified: Secondary | ICD-10-CM | POA: Diagnosis not present

## 2016-04-09 DIAGNOSIS — B9789 Other viral agents as the cause of diseases classified elsewhere: Secondary | ICD-10-CM

## 2016-04-09 HISTORY — DX: Sleep apnea, unspecified: G47.30

## 2016-04-09 MED ORDER — GUAIFENESIN-CODEINE 100-10 MG/5ML PO SOLN
ORAL | 0 refills | Status: DC
Start: 2016-04-09 — End: 2019-10-17

## 2016-04-09 MED ORDER — AZITHROMYCIN 250 MG PO TABS: ORAL_TABLET | ORAL | 0 refills | Status: DC

## 2016-04-09 MED ORDER — PREDNISONE 20 MG PO TABS: ORAL_TABLET | ORAL | 0 refills | Status: DC

## 2016-04-09 MED ORDER — ALBUTEROL SULFATE HFA 108 (90 BASE) MCG/ACT IN AERS: 2.0000 | INHALATION_SPRAY | RESPIRATORY_TRACT | 0 refills | Status: DC | PRN

## 2016-04-09 NOTE — Discharge Instructions (Signed)
Take plain guaifenesin (1200mg extended release tabs such as Mucinex) twice daily, with plenty of water, for cough and congestion.  Get adequate rest.   °May use Afrin nasal spray (or generic oxymetazoline) each morning for about 5 days and then discontinue.  Also recommend using saline nasal spray several times daily and saline nasal irrigation (AYR is a common brand).  Use Flonase nasal spray each morning after using Afrin nasal spray and saline nasal irrigation. °Try warm salt water gargles for sore throat.  °Stop all antihistamines for now, and other non-prescription cough/cold preparations. °  °  °

## 2016-04-09 NOTE — ED Triage Notes (Signed)
Sinus drainage, congestion, productive cough x 10 days

## 2016-04-09 NOTE — ED Provider Notes (Signed)
[facility]    CSN: [id number] Arrival date & time: 04/09/16  1139     History   Chief Complaint Chief Complaint  Patient presents with  . Sinus Problem    HPI Iyad Deroo is a 51 y.o. male.   Patient reports that he developed sinus congestion and mild sore throat about 11 days ago.  He gradually became more fatigued, and developed a productive cough two days ago.  He has developed wheezing and shortness of breath with activity, but he no longer has an albuterol inhaler.  No pleuritic pain.  No fevers, chills, and sweats.  He continues to smoke.  He has had multiple episodes of bronchitis in the past.   The history is provided by the patient.    Past Medical History:  Diagnosis Date  . Bronchitis   . Hypertension   . Sleep apnea     Patient Active Problem List   Diagnosis Date Noted  . Arch pain of right foot 08/21/2013    Past Surgical History:  Procedure Laterality Date  . CHOLECYSTECTOMY    . NASAL SINUS SURGERY         Home Medications    Prior to Admission medications   Medication Sig Start Date End Date Taking? Authorizing Provider  albuterol (PROVENTIL HFA;VENTOLIN HFA) 108 (90 Base) MCG/ACT inhaler Inhale 2 puffs into the lungs every 4 (four) hours as needed for wheezing or shortness of breath. 04/09/16   Lattie Haw, MD  azithromycin (ZITHROMAX Z-PAK) 250 MG tablet Take 2 tabs today; then begin one tab once daily for 4 more days. 04/09/16   Lattie Haw, MD  guaiFENesin-codeine 100-10 MG/5ML syrup Take 10mL by mouth at bedtime as needed for cough 04/09/16   Lattie Haw, MD  metoprolol (TOPROL-XL) 100 MG 24 hr tablet Take 100 mg by mouth daily.      Historical Provider, MD  predniSONE (DELTASONE) 20 MG tablet Take one tab by mouth twice daily for 5 days, then one daily for 3 days. Take with food. 04/09/16   Lattie Haw, MD    Family History Family History  Problem Relation Age of Onset  . Hypertension Mother   .  Hypertension Father   . Heart failure Father   . Cancer Father     kidney  . Hypertension Brother     Social History Social History  Substance Use Topics  . Smoking status: Current Every Day Smoker    Packs/day: 1.00    Years: 30.00    Types: Cigarettes  . Smokeless tobacco: Never Used  . Alcohol use No     Allergies   Allegra [fexofenadine hcl] and Zyrtec [cetirizine]   Review of Systems Review of Systems + sore throat + cough No pleuritic pain + wheezing + nasal congestion + post-nasal drainage No sinus pain/pressure No itchy/red eyes Noearache No hemoptysis + SOB with activity No fever/chills No nausea No vomiting No abdominal pain No diarrhea No urinary symptoms No skin rash + fatigue No myalgias + headache Used OTC meds without relief   Physical Exam Triage Vital Signs ED Triage Vitals  Enc Vitals Group     BP 04/09/16 1159 (!) 165/103     Pulse Rate 04/09/16 1159 95     Resp --      Temp 04/09/16 1159 97.4 F (36.3 C)     Temp Source 04/09/16 1159 Oral     SpO2 04/09/16 1159 94 %     Weight 04/09/16  1159 282 lb (127.9 kg)     Height 04/09/16 1159 6' (1.829 m)     Head Circumference --      Peak Flow --      Pain Score 04/09/16 1204 0     Pain Loc --      Pain Edu? --      Excl. in GC? --    No data found.   Updated Vital Signs BP (!) 165/103 (BP Location: Left Arm)   Pulse 95   Temp 97.4 F (36.3 C) (Oral)   Ht 6' (1.829 m)   Wt 282 lb (127.9 kg)   SpO2 94%   BMI 38.25 kg/m   Visual Acuity Right Eye Distance:   Left Eye Distance:   Bilateral Distance:    Right Eye Near:   Left Eye Near:    Bilateral Near:     Physical Exam Nursing notes and Vital Signs reviewed. Appearance:  Patient appears stated age, and in no acute distress Eyes:  Pupils are equal, round, and reactive to light and accomodation.  Extraocular movement is intact.  Conjunctivae are not inflamed  Ears:  Canals normal.  Tympanic membranes normal.    Nose:  Congested turbinates.  No sinus tenderness.     Pharynx:  Erythematous uvula. Neck:  Supple.  Tender enlarged posterior/lateral nodes are palpated bilaterally  Lungs:   Faint bilateral expiratory wheezes posteriorly.  Breath sounds are equal.  Moving air well. Heart:  Regular rate and rhythm without murmurs, rubs, or gallops.  Abdomen:  Nontender without masses or hepatosplenomegaly.  Bowel sounds are present.  No CVA or flank tenderness.  Extremities:  No edema.  Skin:  No rash present.    UC Treatments / Results  Labs (all labs ordered are listed, but only abnormal results are displayed) Labs Reviewed - No data to display  EKG  EKG Interpretation None       Radiology No results found.  Procedures Procedures (including critical care time)  Medications Ordered in UC Medications - No data to display   Initial Impression / Assessment and Plan / UC Course  I have reviewed the triage vital signs and the nursing notes.  Pertinent labs & imaging results that were available during my care of the patient were reviewed by me and considered in my medical decision making (see chart for details).    Begin Z-pak for atypical coverage, and prednisone burst/taper for bronchospasm. Refill albuterol inhaler Rx for Robitussin AC for night time cough.  Take plain guaifenesin (1200mg  extended release tabs such as Mucinex) twice daily, with plenty of water, for cough and congestion. Get adequate rest.   May use Afrin nasal spray (or generic oxymetazoline) each morning for about 5 days and then discontinue.  Also recommend using saline nasal spray several times daily and saline nasal irrigation (AYR is a common brand).  Use Flonase nasal spray each morning after using Afrin nasal spray and saline nasal irrigation. Try warm salt water gargles for sore throat.  Stop all antihistamines for now, and other non-prescription cough/cold preparations. Followup with Family Doctor if not improved  in one week.     Final Clinical Impressions(s) / UC Diagnoses   Final diagnoses:  Viral URI with cough    New Prescriptions New Prescriptions   ALBUTEROL (PROVENTIL HFA;VENTOLIN HFA) 108 (90 BASE) MCG/ACT INHALER    Inhale 2 puffs into the lungs every 4 (four) hours as needed for wheezing or shortness of breath.   AZITHROMYCIN (ZITHROMAX Z-PAK)  250 MG TABLET    Take 2 tabs today; then begin one tab once daily for 4 more days.   GUAIFENESIN-CODEINE 100-10 MG/5ML SYRUP    Take 10mL by mouth at bedtime as needed for cough   PREDNISONE (DELTASONE) 20 MG TABLET    Take one tab by mouth twice daily for 5 days, then one daily for 3 days. Take with food.     Lattie HawStephen A Darnetta Kesselman, MD 04/09/16 1242

## 2016-04-26 ENCOUNTER — Encounter: Payer: Self-pay | Admitting: Emergency Medicine

## 2016-04-26 ENCOUNTER — Emergency Department
Admission: EM | Admit: 2016-04-26 | Discharge: 2016-04-26 | Disposition: A | Discharge status: Home / Self Care | Payer: BLUE CROSS/BLUE SHIELD | Source: Home / Self Care | Attending: Family Medicine | Admitting: Family Medicine

## 2016-04-26 DIAGNOSIS — J209 Acute bronchitis, unspecified: Secondary | ICD-10-CM

## 2016-04-26 MED ORDER — LEVOFLOXACIN 500 MG PO TABS: 500.0000 mg | ORAL_TABLET | Freq: Every day | ORAL | 0 refills | Status: DC

## 2016-04-26 MED ORDER — BENZONATATE 100 MG PO CAPS: 100.0000 mg | ORAL_CAPSULE | Freq: Three times a day (TID) | ORAL | 0 refills | Status: DC

## 2016-04-26 NOTE — ED Provider Notes (Signed)
CSN: [id number]     Arrival date & time 04/26/16  1759 History   First MD Initiated Contact with Patient 04/26/16 1814     Chief Complaint  Patient presents with  . Cough   (Consider location/radiation/quality/duration/timing/severity/associated sxs/prior Treatment) HPI  Jeremy Melton is a 51 y.o. male presenting to UC with c/o 2-3 days of worsening cough, congestion, and shortness of breath.  Pt was seen at [facility] 2 weeks ago for similar symptoms. He has completed a course of prednisone and azithromycin. Symptoms initially improved significantly but never resolved completely.  Denies hx of asthma or COPD.  Pt is a smoker.  He does have an albuterol inhaler from last visit but has not used recently. Denies sick contacts or recent travel.    Past Medical History:  Diagnosis Date  . Bronchitis   . Hypertension   . Sleep apnea    Past Surgical History:  Procedure Laterality Date  . CHOLECYSTECTOMY    . NASAL SINUS SURGERY     Family History  Problem Relation Age of Onset  . Hypertension Mother   . Hypertension Father   . Heart failure Father   . Cancer Father     kidney  . Hypertension Brother    Social History  Substance Use Topics  . Smoking status: Current Every Day Smoker    Packs/day: 1.00    Years: 30.00    Types: Cigarettes  . Smokeless tobacco: Never Used  . Alcohol use No    Review of Systems  Constitutional: Negative for chills and fever.  HENT: Positive for congestion and rhinorrhea. Negative for ear pain, sore throat, trouble swallowing and voice change.   Respiratory: Positive for cough, chest tightness, shortness of breath and wheezing.   Cardiovascular: Negative for chest pain and palpitations.  Gastrointestinal: Negative for abdominal pain, diarrhea, nausea and vomiting.  Musculoskeletal: Negative for arthralgias, back pain and myalgias.  Skin: Negative for rash.    Allergies  Allegra [fexofenadine hcl] and Zyrtec [cetirizine]  Home Medications    Prior to Admission medications   Medication Sig Start Date End Date Taking? Authorizing Provider  metoprolol (TOPROL-XL) 100 MG 24 hr tablet Take 100 mg by mouth daily.     Yes Historical Provider, MD  albuterol (PROVENTIL HFA;VENTOLIN HFA) 108 (90 Base) MCG/ACT inhaler Inhale 2 puffs into the lungs every 4 (four) hours as needed for wheezing or shortness of breath. 04/09/16   Lattie Haw, MD  benzonatate (TESSALON) 100 MG capsule Take 1-2 capsules (100-200 mg total) by mouth every 8 (eight) hours. 04/26/16   Junius Finner, PA-C  guaiFENesin-codeine 100-10 MG/5ML syrup Take 10mL by mouth at bedtime as needed for cough 04/09/16   Lattie Haw, MD  levofloxacin (LEVAQUIN) 500 MG tablet Take 1 tablet (500 mg total) by mouth daily. 04/26/16   Junius Finner, PA-C  predniSONE (DELTASONE) 20 MG tablet Take one tab by mouth twice daily for 5 days, then one daily for 3 days. Take with food. 04/09/16   Lattie Haw, MD   Meds Ordered and Administered this Visit  Medications - No data to display  BP (!) 139/92 (BP Location: Left Arm)   Pulse 89   Temp 98.5 F (36.9 C) (Oral)   Wt 281 lb 6.4 oz (127.6 kg)   SpO2 97%   BMI 38.16 kg/m  No data found.   Physical Exam  Constitutional: He is oriented to person, place, and time. He appears well-developed and well-nourished. No distress.  HENT:  Head: Normocephalic and atraumatic.  Right Ear: Tympanic membrane normal.  Left Ear: Tympanic membrane normal.  Nose: Nose normal.  Mouth/Throat: Uvula is midline, oropharynx is clear and moist and mucous membranes are normal.  Eyes: Conjunctivae and EOM are normal. No scleral icterus.  Neck: Normal range of motion. Neck supple.  Cardiovascular: Normal rate, regular rhythm and normal heart sounds.   Pulmonary/Chest: Effort normal. No respiratory distress. He has wheezes. He has rhonchi. He has no rales.  Diffuse wheeze and rhonchi, worse in Right lower lung field  Abdominal: Soft. He exhibits no  distension. There is no tenderness.  Musculoskeletal: Normal range of motion.  Neurological: He is alert and oriented to person, place, and time.  Skin: Skin is warm and dry. He is not diaphoretic.  Psychiatric: He has a normal mood and affect. His behavior is normal.  Nursing note and vitals reviewed.   Urgent Care Course     Procedures (including critical care time)  Labs Review Labs Reviewed - No data to display  Imaging Review No results found.    MDM   1. Acute bronchitis, unspecified organism    Pt c/o worsening cough, congestion and SOB.  Recently treated with azithromycin.  Will cover for pneumonia with Levaquin  once daily for 7 days.  Tessalon for cough Encouraged to use albuterol inhaler every 4-6 hours as needed for coughing and chest tightness.  Encouraged to f/u with PCP in 1 week if not improving, sooner if worsening.     Junius Finner, PA-C 04/26/16 1911

## 2016-04-26 NOTE — ED Triage Notes (Signed)
Patient presents today with a complaint of cough, congestion and shortness of breath. He states that he was here two weeks ago and prescribed antibiotics, he states that he got better and then symptoms came back.

## 2016-04-29 ENCOUNTER — Telehealth: Payer: Self-pay | Admitting: *Deleted

## 2016-04-29 NOTE — Telephone Encounter (Signed)
Patient returned call. He is improving and doing well with Levaquin. He reports Tessalon is not relieving his cough. He has not tried Delsym or Robitussin, I encouraged him to try otc measures. Call back if not relieving.

## 2016-04-29 NOTE — Telephone Encounter (Signed)
Callback: LMOM f/u from visit. Call to see if patient was doing well with Levaquin. Call back as needed.

## 2018-12-13 DIAGNOSIS — Z1211 Encounter for screening for malignant neoplasm of colon: Secondary | ICD-10-CM | POA: Insufficient documentation

## 2018-12-13 DIAGNOSIS — K648 Other hemorrhoids: Secondary | ICD-10-CM | POA: Insufficient documentation

## 2019-02-28 DIAGNOSIS — K642 Third degree hemorrhoids: Secondary | ICD-10-CM | POA: Insufficient documentation

## 2019-10-17 ENCOUNTER — Emergency Department
Admission: EM | Admit: 2019-10-17 | Discharge: 2019-10-17 | Disposition: A | Discharge status: Home / Self Care | Payer: BC Managed Care – PPO | Source: Home / Self Care | Attending: Family Medicine | Admitting: Family Medicine

## 2019-10-17 ENCOUNTER — Other Ambulatory Visit: Payer: Self-pay

## 2019-10-17 DIAGNOSIS — J069 Acute upper respiratory infection, unspecified: Secondary | ICD-10-CM

## 2019-10-17 MED ORDER — PREDNISONE 20 MG PO TABS: ORAL_TABLET | ORAL | 0 refills | Status: DC

## 2019-10-17 MED ORDER — GUAIFENESIN-CODEINE 100-10 MG/5ML PO SOLN: ORAL | 0 refills | Status: DC

## 2019-10-17 MED ORDER — ALBUTEROL SULFATE HFA 108 (90 BASE) MCG/ACT IN AERS: 2.0000 | INHALATION_SPRAY | RESPIRATORY_TRACT | 0 refills | Status: DC | PRN

## 2019-10-17 MED ORDER — AZITHROMYCIN 250 MG PO TABS: ORAL_TABLET | ORAL | 0 refills | Status: DC

## 2019-10-17 NOTE — Discharge Instructions (Addendum)
Take plain guaifenesin (1200mg  extended release tabs such as Mucinex) twice daily, with plenty of water, for cough and congestion. Get adequate rest.   May use Afrin nasal spray (or generic oxymetazoline) each morning for about 5 days and then discontinue.  Also recommend using saline nasal spray several times daily and saline nasal irrigation (AYR is a common brand).  Use Flonase nasal spray each morning after using Afrin nasal spray and saline nasal irrigation. Try warm salt water gargles for sore throat.  Stop all antihistamines for now, and other non-prescription cough/cold preparations. May take Tylenol as needed for fever, headache, etc.

## 2019-10-17 NOTE — ED Provider Notes (Signed)
[facility]    CSN: [id number] Arrival date & time: 10/17/19  1005      History   Chief Complaint Chief Complaint  Patient presents with  . Sinus issues  . Cough    HPI Jeremy Melton is a 54 y.o. male.   One week ago patient developed sore throat and sinus congestion.  During the past 3 to 4 days he has developed a non-productive cough.  He has developed wheezing and shortness of breath with activity.  He denies fevers, chills, and sweats.  He denies chest tightness and changes in taste/smell. He has history of recurring bronchitis.  The history is provided by the patient.    Past Medical History:  Diagnosis Date  . Bronchitis   . Hypertension   . Sleep apnea     Patient Active Problem List   Diagnosis Date Noted  . Arch pain of right foot 08/21/2013    Past Surgical History:  Procedure Laterality Date  . CHOLECYSTECTOMY    . NASAL SINUS SURGERY         Home Medications    Prior to Admission medications   Medication Sig Start Date End Date Taking? Authorizing Provider  hydrochlorothiazide (HYDRODIURIL) 25 MG tablet Take by mouth. 02/12/19  Yes [provider]  metoprolol succinate (TOPROL-XL) 100 MG 24 hr tablet Take 1 tablet by mouth daily. 11/13/18  Yes [provider]  albuterol (VENTOLIN HFA) 108 (90 Base) MCG/ACT inhaler Inhale 2 puffs into the lungs every 4 (four) hours as needed for wheezing or shortness of breath. 10/17/19   Lattie Haw, MD  azithromycin (ZITHROMAX Z-PAK) 250 MG tablet Take 2 tabs today; then begin one tab once daily for 4 more days. (Rx void after 08/12/13) 10/17/19   Lattie Haw, MD  benzonatate (TESSALON) 100 MG capsule Take 1-2 capsules (100-200 mg total) by mouth every 8 (eight) hours. 04/26/16   Lurene Shadow, PA-C  guaiFENesin-codeine 100-10 MG/5ML syrup Take 50mL by mouth at bedtime as needed for cough 10/17/19   Lattie Haw, MD  levofloxacin (LEVAQUIN) 500 MG tablet Take 1 tablet (500  mg total) by mouth daily. 04/26/16   Lurene Shadow, PA-C  lisinopril (ZESTRIL) 40 MG tablet SMARTSIG:1 Tablet(s) By Mouth Daily 06/22/19   [provider]  predniSONE (DELTASONE) 20 MG tablet Take one tab by mouth twice daily for 5 days, then one daily for 3 days. Take with food. 10/17/19   Lattie Haw, MD    Family History Family History  Problem Relation Age of Onset  . Hypertension Mother   . Hypertension Father   . Heart failure Father   . Cancer Father        kidney  . Hypertension Brother     Social History Social History   Tobacco Use  . Smoking status: Current Every Day Smoker    Packs/day: 1.00    Years: 30.00    Pack years: 30.00    Types: Cigarettes  . Smokeless tobacco: Never Used  Substance Use Topics  . Alcohol use: No  . Drug use: No     Allergies   Bee venom, Loratadine, Allegra [fexofenadine hcl], and Zyrtec [cetirizine]   Review of Systems Review of Systems + sore throat + cough No pleuritic pain + wheezing + nasal congestion + post-nasal drainage No sinus pain/pressure No itchy/red eyes No earache No hemoptysis + SOB No fever/chills No nausea No vomiting No abdominal pain No diarrhea No urinary symptoms No  skin rash + fatigue No myalgias No headache   Physical Exam Triage Vital Signs ED Triage Vitals [10/17/19 1028]  Enc Vitals Group     BP 130/84     Pulse Rate 67     Resp 18     Temp 98.5 F (36.9 C)     Temp Source Oral     SpO2 98 %     Weight      Height      Head Circumference      Peak Flow      Pain Score 0     Pain Loc      Pain Edu?      Excl. in GC?    No data found.  Updated Vital Signs BP 130/84 (BP Location: Right Arm)   Pulse 67   Temp 98.5 F (36.9 C) (Oral)   Resp 18   SpO2 98%   Visual Acuity Right Eye Distance:   Left Eye Distance:   Bilateral Distance:    Right Eye Near:   Left Eye Near:    Bilateral Near:     Physical Exam Nursing notes and Vital Signs  reviewed. Appearance:  Patient appears stated age, and in no acute distress Eyes:  Pupils are equal, round, and reactive to light and accomodation.  Extraocular movement is intact.  Conjunctivae are not inflamed  Ears:  Canals normal.  Tympanic membranes normal.  Nose:  Mildly congested turbinates.  No sinus tenderness.  Pharynx:  Normal Neck:  Supple.  Mildly enlarged lateral nodes are present, tender to palpation on the left.   Lungs:  Clear to auscultation.  Breath sounds are equal.  Moving air well. Heart:  Regular rate and rhythm without murmurs, rubs, or gallops.  Abdomen:  Nontender without masses or hepatosplenomegaly.  Bowel sounds are present.  No CVA or flank tenderness.  Extremities:  No edema.  Skin:  No rash present.   UC Treatments / Results  Labs (all labs ordered are listed, but only abnormal results are displayed) Labs Reviewed - No data to display  EKG   Radiology No results found.  Procedures Procedures (including critical care time)  Medications Ordered in UC Medications - No data to display  Initial Impression / Assessment and Plan / UC Course  I have reviewed the triage vital signs and the nursing notes.  Pertinent labs & imaging results that were available during my care of the patient were reviewed by me and considered in my medical decision making (see chart for details).    Because of his history of bronchitis, begin prednisone burst/taper, Z-pak, albuterol MDI. Rx for Robitussin [facility] for night time cough  Controlled Substance Prescriptions I have consulted the [place] Controlled Substances Registry for this patient, and feel the risk/benefit ratio today is favorable for proceeding with this prescription for a controlled substance.   Followup with Family Doctor if not improved in about 10 days.   Final Clinical Impressions(s) / UC Diagnoses   Final diagnoses:  Viral URI with cough     Discharge Instructions     Take plain guaifenesin (1200mg   extended release tabs such as Mucinex) twice daily, with plenty of water, for cough and congestion. Get adequate rest.   May use Afrin nasal spray (or generic oxymetazoline) each morning for about 5 days and then discontinue.  Also recommend using saline nasal spray several times daily and saline nasal irrigation (AYR is a common brand).  Use Flonase nasal spray each morning after using  Afrin nasal spray and saline nasal irrigation. Try warm salt water gargles for sore throat.  Stop all antihistamines for now, and other non-prescription cough/cold preparations. May take Tylenol as needed for fever, headache, etc.       ED Prescriptions    Medication Sig Dispense Auth. Provider   albuterol (VENTOLIN HFA) 108 (90 Base) MCG/ACT inhaler Inhale 2 puffs into the lungs every 4 (four) hours as needed for wheezing or shortness of breath. 18 g Lattie Haw, MD   predniSONE (DELTASONE) 20 MG tablet Take one tab by mouth twice daily for 5 days, then one daily for 3 days. Take with food. 13 tablet Lattie Haw, MD   guaiFENesin-codeine 100-10 MG/5ML syrup Take 94mL by mouth at bedtime as needed for cough 50 mL Lattie Haw, MD   azithromycin (ZITHROMAX Z-PAK) 250 MG tablet Take 2 tabs today; then begin one tab once daily for 4 more days. (Rx void after 08/12/13) 6 tablet Lattie Haw, MD        Lattie Haw, MD 10/20/19 [phone]

## 2019-10-17 NOTE — ED Triage Notes (Signed)
Pt c/o sinus drainage x 1 week. Productive cough x 3 days. Hx of bronchitis. Taking wifes leftover amoxicillin. Denies fever.

## 2021-07-19 ENCOUNTER — Encounter: Payer: Self-pay | Admitting: Emergency Medicine

## 2021-07-19 ENCOUNTER — Emergency Department
Admission: EM | Admit: 2021-07-19 | Discharge: 2021-07-19 | Disposition: A | Discharge status: Home / Self Care | Payer: BC Managed Care – PPO | Source: Home / Self Care | Attending: Family Medicine | Admitting: Family Medicine

## 2021-07-19 DIAGNOSIS — M5442 Lumbago with sciatica, left side: Secondary | ICD-10-CM

## 2021-07-19 DIAGNOSIS — M62830 Muscle spasm of back: Secondary | ICD-10-CM

## 2021-07-19 MED ORDER — METHYLPREDNISOLONE 4 MG PO TBPK: ORAL_TABLET | ORAL | 0 refills | Status: DC

## 2021-07-19 MED ORDER — HYDROCODONE-ACETAMINOPHEN 7.5-325 MG PO TABS
1.0000 | ORAL_TABLET | Freq: Four times a day (QID) | ORAL | 0 refills | Status: DC | PRN
Start: 2021-07-19 — End: 2022-03-19

## 2021-07-19 MED ORDER — CYCLOBENZAPRINE HCL 10 MG PO TABS: 10.0000 mg | ORAL_TABLET | Freq: Two times a day (BID) | ORAL | 0 refills | Status: DC | PRN

## 2021-09-19 ENCOUNTER — Ambulatory Visit (INDEPENDENT_AMBULATORY_CARE_PROVIDER_SITE_OTHER): Payer: BC Managed Care – PPO

## 2021-09-19 ENCOUNTER — Ambulatory Visit
Admission: EM | Admit: 2021-09-19 | Discharge: 2021-09-19 | Disposition: A | Discharge status: Home / Self Care | Payer: BC Managed Care – PPO | Attending: Family Medicine | Admitting: Family Medicine

## 2021-09-19 ENCOUNTER — Other Ambulatory Visit: Payer: Self-pay

## 2021-09-19 DIAGNOSIS — M5432 Sciatica, left side: Secondary | ICD-10-CM | POA: Diagnosis not present

## 2021-09-19 DIAGNOSIS — M5442 Lumbago with sciatica, left side: Secondary | ICD-10-CM | POA: Diagnosis not present

## 2021-09-19 DIAGNOSIS — M7918 Myalgia, other site: Secondary | ICD-10-CM

## 2021-09-19 NOTE — ED Triage Notes (Signed)
Pt c/o pain in left leg pain and buttocks onset for 5 weeks.

## 2021-09-19 NOTE — ED Provider Notes (Signed)
[facility]rem[facility]    CSN: [id number] Arrival date & time: 09/19/21  0935      History   Chief Complaint Chief Complaint  Patient presents with   Leg Pain    Left leg pain and buttocks    HPI Jeremy Melton is a 56 y.o. male.   HPI 56 year old male presents with left buttocks/left leg pain for 5 weeks.  PMH significant for obesity, HTN, and OSA.  Patient was evaluated here for similar episode on 07/19/2021.  Past Medical History:  Diagnosis Date   Bronchitis    Hypertension    Sleep apnea     Patient Active Problem List   Diagnosis Date Noted   Arch pain of right foot 08/21/2013    Past Surgical History:  Procedure Laterality Date   CHOLECYSTECTOMY     NASAL SINUS SURGERY         Home Medications    Prior to Admission medications   Medication Sig Start Date End Date Taking? Authorizing Provider  cyclobenzaprine (FLEXERIL) 10 MG tablet Take 1 tablet (10 mg total) by mouth 2 (two) times daily as needed for muscle spasms. 07/19/21   Eustace Moore, MD  hydrochlorothiazide (HYDRODIURIL) 25 MG tablet Take by mouth. 02/12/19   [provider]  HYDROcodone-acetaminophen (NORCO) 7.5-325 MG tablet Take 1 tablet by mouth every 6 (six) hours as needed for moderate pain. 07/19/21   Eustace Moore, MD  lisinopril (ZESTRIL) 40 MG tablet SMARTSIG:1 Tablet(s) By Mouth Daily 06/22/19   [provider]  methylPREDNISolone (MEDROL DOSEPAK) 4 MG TBPK tablet tad 07/19/21   Eustace Moore, MD  metoprolol succinate (TOPROL-XL) 100 MG 24 hr tablet Take 1 tablet by mouth daily. 11/13/18   [provider]    Family History Family History  Problem Relation Age of Onset   Hypertension Mother    Hypertension Father    Heart failure Father    Cancer Father        kidney   Hypertension Brother     Social History Social History   Tobacco Use   Smoking status: Every Day    Packs/day: 1.00    Years: 30.00    Total pack years: 30.00     Types: Cigarettes   Smokeless tobacco: Never  Vaping Use   Vaping Use: Never used  Substance Use Topics   Alcohol use: No   Drug use: No     Allergies   Bee venom, Loratadine, Allegra [fexofenadine hcl], and Zyrtec [cetirizine]   Review of Systems Review of Systems  Musculoskeletal:        Left sided buttocks/leg pain x5 weeks     Physical Exam Triage Vital Signs ED Triage Vitals  Enc Vitals Group     BP 09/19/21 0957 105/71     Pulse Rate 09/19/21 0957 76     Resp 09/19/21 0957 18     Temp 09/19/21 0957 98.5 F (36.9 C)     Temp Source 09/19/21 0957 Oral     SpO2 09/19/21 0957 99 %     Weight 09/19/21 0959 250 lb (113.4 kg)     Height 09/19/21 0959 6\' 1"  (1.854 m)     Head Circumference --      Peak Flow --      Pain Score 09/19/21 0959 5     Pain Loc --      Pain Edu? --      Excl. in GC? --    No data found.  Updated Vital Signs BP 105/71 (BP Location: Right Arm)   Pulse 76   Temp 98.5 F (36.9 C) (Oral)   Resp 18   Ht 6\' 1"  (1.854 m)   Wt 250 lb (113.4 kg)   SpO2 99%   BMI 32.98 kg/m    Physical Exam Vitals and nursing note reviewed.  Constitutional:      Appearance: He is obese.  HENT:     Head: Normocephalic and atraumatic.     Mouth/Throat:     Mouth: Mucous membranes are moist.     Pharynx: Oropharynx is clear.  Eyes:     Extraocular Movements: Extraocular movements intact.     Conjunctiva/sclera: Conjunctivae normal.     Pupils: Pupils are equal, round, and reactive to light.  Cardiovascular:     Rate and Rhythm: Normal rate and regular rhythm.     Pulses: Normal pulses.     Heart sounds: Normal heart sounds. No murmur heard. Pulmonary:     Effort: Pulmonary effort is normal.     Breath sounds: Normal breath sounds. No wheezing, rhonchi or rales.  Musculoskeletal:        General: Normal range of motion.     Cervical back: Normal range of motion and neck supple.  Skin:    General: Skin is warm and dry.  Neurological:      General: No focal deficit present.     Mental Status: He is oriented to person, place, and time. Mental status is at baseline.      UC Treatments / Results  Labs (all labs ordered are listed, but only abnormal results are displayed) Labs Reviewed - No data to display  EKG   Radiology DG Lumbar Spine Complete  Result Date: 09/19/2021 CLINICAL DATA:  Left-sided sciatica for 2 months.  Lower back pain EXAM: LUMBAR SPINE - COMPLETE 4+ VIEW COMPARISON:  None Available. FINDINGS: Lumbosacral disc space narrowing. Mild lower lumbar endplate ridging. No acute fracture or lytic lesion. Negative for erosion. IMPRESSION: Mild degenerative changes without acute finding. Electronically Signed   By: 09/21/2021 M.D.   On: 09/19/2021 11:17    Procedures Procedures (including critical care time)  Medications Ordered in UC Medications - No data to display  Initial Impression / Assessment and Plan / UC Course  I have reviewed the triage vital signs and the nursing notes.  Pertinent labs & imaging results that were available during my care of the patient were reviewed by me and considered in my medical decision making (see chart for details).     MDM: 1. Sciatica of left side-x-ray of LS spine revealed above, prior to LS-spine x-ray patient reports left-sided buttock pain that travels down outside of his left leg; 2.  Left buttock pain; after LS-spine x-ray patient reports medial left leg pain start 12 hours after having bowel movement. Advised/informed patient of lower back x-ray results with hard copy provided to patient.  Advised patient to follow-up with his PCP regarding left buttock pain 12 hours after bowel movement for the past 2 months.  Advised patient if symptoms worsen and/or unresolved please go to nearest ED for further evaluation.  Patient discharged home, hemodynamically stable. Final Clinical Impressions(s) / UC Diagnoses   Final diagnoses:  Sciatica of left side  Left buttock  pain     Discharge Instructions      Advised/informed patient of lower back x-ray results with hard copy provided to patient.  Advised patient to follow-up with his PCP regarding left buttock  pain 12 hours after bowel movement for the past 2 months.  Advised patient if symptoms worsen and/or unresolved please go to nearest ED for further evaluation.     ED Prescriptions   None    PDMP not reviewed this encounter.   Trevor Iha, FNP 09/19/21 1156

## 2021-09-19 NOTE — Discharge Instructions (Addendum)
Advised/informed patient of lower back x-ray results with hard copy provided to patient.  Advised patient to follow-up with his PCP regarding left buttock pain 12 hours after bowel movement for the past 2 months.  Advised patient if symptoms worsen and/or unresolved please go to nearest ED for further evaluation.

## 2021-09-29 LAB — PSA: PSA: 0.8

## 2021-10-06 DIAGNOSIS — K602 Anal fissure, unspecified: Secondary | ICD-10-CM | POA: Insufficient documentation

## 2021-10-06 LAB — BASIC METABOLIC PANEL
BUN: 24 — AB (ref 4–21)
CO2: 22 (ref 13–22)
Chloride: 105 (ref 99–108)
Creatinine: 1.5 — AB (ref 0.6–1.3)
Glucose: 101
Potassium: 4.2 mEq/L (ref 3.5–5.1)
Sodium: 143 (ref 137–147)

## 2021-10-06 LAB — COMPREHENSIVE METABOLIC PANEL
Calcium: 9.2 (ref 8.7–10.7)
eGFR: 55

## 2022-01-03 ENCOUNTER — Telehealth: Payer: Self-pay | Admitting: Emergency Medicine

## 2022-01-03 ENCOUNTER — Ambulatory Visit
Admission: RE | Admit: 2022-01-03 | Discharge: 2022-01-03 | Disposition: A | Discharge status: Home / Self Care | Payer: BC Managed Care – PPO | Source: Ambulatory Visit | Attending: Family Medicine | Admitting: Family Medicine

## 2022-01-03 VITALS — BP 125/84 | HR 57 | Temp 97.9°F | Resp 18 | Ht 73.0 in | Wt 260.0 lb

## 2022-01-03 DIAGNOSIS — L720 Epidermal cyst: Secondary | ICD-10-CM | POA: Diagnosis not present

## 2022-01-03 MED ORDER — DOXYCYCLINE HYCLATE 100 MG PO CAPS: 100.0000 mg | ORAL_CAPSULE | Freq: Two times a day (BID) | ORAL | 0 refills | Status: DC

## 2022-01-03 NOTE — Telephone Encounter (Signed)
Call by this RN per provider request to obtain more information about visit later on today. Call back # left.

## 2022-01-03 NOTE — ED Provider Notes (Signed)
[facility]    CSN: [id number] Arrival date & time: 01/03/22  1357      History   Chief Complaint Chief Complaint  Patient presents with   Abscess    Entered by patient    HPI Jeremy Melton is a 56 y.o. male.   HPI  She has a cyst on his anterior chest he would like to have checked.  His wife tried to lance it but it did not drain  Past Medical History:  Diagnosis Date   Bronchitis    Hypertension    Sleep apnea     Patient Active Problem List   Diagnosis Date Noted   Arch pain of right foot 08/21/2013    Past Surgical History:  Procedure Laterality Date   CHOLECYSTECTOMY     NASAL SINUS SURGERY         Home Medications    Prior to Admission medications   Medication Sig Start Date End Date Taking? Authorizing Provider  doxycycline (VIBRAMYCIN) 100 MG capsule Take 1 capsule (100 mg total) by mouth 2 (two) times daily. 01/03/22  Yes Eustace Moore, MD  hydrochlorothiazide (HYDRODIURIL) 25 MG tablet Take by mouth. 02/12/19  Yes [provider]  lisinopril (ZESTRIL) 40 MG tablet SMARTSIG:1 Tablet(s) By Mouth Daily 06/22/19  Yes [provider]  metoprolol succinate (TOPROL-XL) 100 MG 24 hr tablet Take 1 tablet by mouth daily. 11/13/18  Yes [provider]  cyclobenzaprine (FLEXERIL) 10 MG tablet Take 1 tablet (10 mg total) by mouth 2 (two) times daily as needed for muscle spasms. 07/19/21   Eustace Moore, MD  HYDROcodone-acetaminophen (NORCO) 7.5-325 MG tablet Take 1 tablet by mouth every 6 (six) hours as needed for moderate pain. 07/19/21   Eustace Moore, MD  methylPREDNISolone (MEDROL DOSEPAK) 4 MG TBPK tablet tad 07/19/21   Eustace Moore, MD    Family History Family History  Problem Relation Age of Onset   Hypertension Mother    Hypertension Father    Heart failure Father    Cancer Father        kidney   Hypertension Brother     Social History Social History   Tobacco Use   Smoking status:  Every Day    Packs/day: 1.00    Years: 30.00    Total pack years: 30.00    Types: Cigarettes   Smokeless tobacco: Never  Vaping Use   Vaping Use: Never used  Substance Use Topics   Alcohol use: No   Drug use: No     Allergies   Bee venom, Loratadine, Allegra [fexofenadine hcl], and Zyrtec [cetirizine]   Review of Systems Review of Systems  See HPI Physical Exam Triage Vital Signs ED Triage Vitals  Enc Vitals Group     BP 01/03/22 1508 125/84     Pulse Rate 01/03/22 1508 (!) 57     Resp 01/03/22 1508 18     Temp 01/03/22 1508 97.9 F (36.6 C)     Temp Source 01/03/22 1508 Oral     SpO2 01/03/22 1508 98 %     Weight 01/03/22 1510 260 lb (117.9 kg)     Height 01/03/22 1510 6\' 1"  (1.854 m)     Head Circumference --      Peak Flow --      Pain Score 01/03/22 1509 1     Pain Loc --      Pain Edu? --      Excl. in GC? --  No data found.  Updated Vital Signs BP 125/84 (BP Location: Left Arm)   Pulse (!) 57   Temp 97.9 F (36.6 C) (Oral)   Resp 18   Ht 6\' 1"  (1.854 m)   Wt 117.9 kg   SpO2 98%   BMI 34.30 kg/m    Physical Exam Constitutional:      General: He is not in acute distress.    Appearance: He is well-developed.  HENT:     Head: Normocephalic and atraumatic.  Eyes:     Conjunctiva/sclera: Conjunctivae normal.     Pupils: Pupils are equal, round, and reactive to light.  Cardiovascular:     Rate and Rhythm: Normal rate.  Pulmonary:     Effort: Pulmonary effort is normal. No respiratory distress.  Chest:       Comments: Location of cyst Abdominal:     General: There is no distension.     Palpations: Abdomen is soft.  Musculoskeletal:        General: Normal range of motion.     Cervical back: Normal range of motion.  Skin:    General: Skin is warm and dry.     Comments: Patient has a 2 x 2-1/2 cm deep cyst on the anterior chest wall towards the right.  There is no erythema or fluctuance.  Neurological:     Mental Status: He is alert.       UC Treatments / Results  Labs (all labs ordered are listed, but only abnormal results are displayed) Labs Reviewed - No data to display  EKG   Radiology No results found.  Procedures Procedures (including critical care time)  Medications Ordered in UC Medications - No data to display  Initial Impression / Assessment and Plan / UC Course  I have reviewed the triage vital signs and the nursing notes.  Pertinent labs & imaging results that were available during my care of the patient were reviewed by me and considered in my medical decision making (see chart for details).     This is not appropriate to I&D.  There is no evidence of infection.  Discussed cyst removal at a future date Final Clinical Impressions(s) / UC Diagnoses   Final diagnoses:  Epidermal inclusion cyst     Discharge Instructions      Take doxycycline 2 times a day It is important to take this with food Doxycycline is a good skin antibiotic that kills MRSA Use warm compresses to area. See your doctor if this fails to improve.  It may need to be removed if it remains large and uncomfortable   ED Prescriptions     Medication Sig Dispense Auth. Provider   doxycycline (VIBRAMYCIN) 100 MG capsule Take 1 capsule (100 mg total) by mouth 2 (two) times daily. 14 capsule , MD      PDMP not reviewed this encounter.   Eustace Moore, MD 01/03/22 [phone]

## 2022-01-03 NOTE — ED Triage Notes (Signed)
Patient c/o abscess on right side of chest for several weeks.  Some tenderness to touch.  Some blood and clear fluid drainage.  Patient denies any OTC pain meds.

## 2022-01-03 NOTE — Discharge Instructions (Addendum)
Take doxycycline 2 times a day It is important to take this with food Doxycycline is a good skin antibiotic that kills MRSA Use warm compresses to area. See your doctor if this fails to improve.  It may need to be removed if it remains large and uncomfortable

## 2022-03-01 ENCOUNTER — Ambulatory Visit: Payer: BC Managed Care – PPO | Admitting: Medical-Surgical

## 2022-03-19 ENCOUNTER — Ambulatory Visit (INDEPENDENT_AMBULATORY_CARE_PROVIDER_SITE_OTHER): Payer: 59 | Admitting: Medical-Surgical

## 2022-03-19 ENCOUNTER — Encounter: Payer: Self-pay | Admitting: Medical-Surgical

## 2022-03-19 VITALS — BP 129/73 | HR 53 | Resp 16 | Ht 72.0 in | Wt 271.1 lb

## 2022-03-19 DIAGNOSIS — I10 Essential (primary) hypertension: Secondary | ICD-10-CM | POA: Diagnosis not present

## 2022-03-19 DIAGNOSIS — M722 Plantar fascial fibromatosis: Secondary | ICD-10-CM | POA: Insufficient documentation

## 2022-03-19 DIAGNOSIS — G4733 Obstructive sleep apnea (adult) (pediatric): Secondary | ICD-10-CM

## 2022-03-19 DIAGNOSIS — Z7689 Persons encountering health services in other specified circumstances: Secondary | ICD-10-CM

## 2022-03-19 DIAGNOSIS — F17210 Nicotine dependence, cigarettes, uncomplicated: Secondary | ICD-10-CM

## 2022-03-19 MED ORDER — HYDROCHLOROTHIAZIDE 25 MG PO TABS: 25.0000 mg | ORAL_TABLET | Freq: Every day | ORAL | 3 refills | Status: DC

## 2022-03-19 MED ORDER — LISINOPRIL 40 MG PO TABS: 40.0000 mg | ORAL_TABLET | Freq: Every day | ORAL | 3 refills | Status: DC

## 2022-03-19 MED ORDER — SILDENAFIL CITRATE 20 MG PO TABS: 20.0000 mg | ORAL_TABLET | Freq: Every day | ORAL | 11 refills | Status: AC | PRN

## 2022-03-19 MED ORDER — EPINEPHRINE 0.3 MG/0.3ML IJ SOAJ: 0.3000 mg | INTRAMUSCULAR | 11 refills | Status: AC | PRN

## 2022-03-19 MED ORDER — METOPROLOL SUCCINATE ER 100 MG PO TB24: 100.0000 mg | ORAL_TABLET | Freq: Every day | ORAL | 3 refills | Status: DC

## 2022-03-19 NOTE — Progress Notes (Signed)
New Patient Office Visit  Subjective:  Patient ID: Jeremy Melton, male    DOB: 1965-05-30  Age: 57 y.o. MRN: [id number]  CC:  Chief Complaint  Patient presents with   Establish Care   HPI Jeremy Melton presents to establish care.  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  He comes to [place] from [facility] and reports his wife is a patient of mine.  Sleep apnea: Has been using his CPAP nightly for most of the last 20 years.  Tolerates well and feels that the CPAP is still very useful.  Previously saw a sleep specialist however notes that he is doing fine on supplies at this time.  Hypertension: Taking lisinopril 40 mg daily, Toprol-XL 100 mg daily, and hydrochlorothiazide 25 mg daily, tolerating well without side effects not checking blood pressure at home.  Continues to add salt to his diet.  Getting very little exercise. Denies CP, SOB, palpitations, lower extremity edema, dizziness, headaches, or vision changes.  Has a history of colon polyps and repeat his colonoscopies every 3 years.  He had his last colonoscopy about 3 weeks ago.  Reports that his previous GI provider has left the practice and the new one that he was placed with is okay but is interested in establishing with a new GI provider.  Erectile dysfunction: Taking sildenafil 20 mg daily as needed.  Tolerates the medication well without side effects.  Reports the medication still helps and would like to have a refill on file.  Tobacco use: Has been smoking 1 pack of cigarettes per day for the last 40 years or so.  Reports that he is not ready to quit.  Has never had a CT lung cancer screening to his knowledge.  Past Medical History:  Diagnosis Date   Bronchitis    Hypertension    Sleep apnea     Past Surgical History:  Procedure Laterality Date   CHOLECYSTECTOMY     NASAL SINUS SURGERY      Family History  Problem Relation Age of Onset   Hypertension Mother     Hypertension Father    Heart failure Father    Cancer Father        kidney   Hypertension Brother     Social History   Socioeconomic History   Marital status: Married    Spouse name: Not on file   Number of children: Not on file   Years of education: Not on file   Highest education level: Not on file  Occupational History   Not on file  Tobacco Use   Smoking status: Every Day    Packs/day: 1.00    Years: 30.00    Total pack years: 30.00    Types: Cigarettes   Smokeless tobacco: Never  Vaping Use   Vaping Use: Never used  Substance and Sexual Activity   Alcohol use: No   Drug use: No   Sexual activity: Yes    Comment: married  Other Topics Concern   Not on file  Social History Narrative   Not on file   Social Determinants of Health   Financial Resource Strain: Not on file  Food Insecurity: Not on file  Transportation Needs: Not on file  Physical Activity: Not on file  Stress: Not on file  Social Connections: Not on file  Intimate Partner Violence: Not on file    ROS Review of Systems  Constitutional:  Negative for chills, fatigue, fever and  unexpected weight change.  HENT:  Negative for congestion, rhinorrhea, sinus pressure and sore throat.   Eyes:  Negative for visual disturbance.  Respiratory:  Negative for cough, chest tightness, shortness of breath and wheezing.   Cardiovascular:  Negative for chest pain, palpitations and leg swelling.  Gastrointestinal:  Negative for abdominal pain, constipation, diarrhea, nausea and vomiting.  Genitourinary:  Negative for dysuria, frequency and urgency.  Skin:  Negative for rash.  Neurological:  Negative for dizziness, light-headedness and headaches.  Psychiatric/Behavioral:  Negative for dysphoric mood, self-injury, sleep disturbance and suicidal ideas. The patient is not nervous/anxious.     Objective:   Today's Vitals: BP 129/73 (BP Location: Left Arm, Patient Position: Sitting, Cuff Size: Large)   Pulse (!) 53    Resp 16   Ht 6' (1.829 m)   Wt 271 lb 1.9 oz (123 kg)   SpO2 97%   BMI 36.77 kg/m   Physical Exam Vitals and nursing note reviewed.  Constitutional:      General: He is not in acute distress.    Appearance: Normal appearance. He is obese. He is not ill-appearing.  HENT:     Head: Normocephalic and atraumatic.  Cardiovascular:     Rate and Rhythm: Normal rate and regular rhythm.     Pulses: Normal pulses.     Heart sounds: Normal heart sounds. No murmur heard.    No friction rub. No gallop.  Pulmonary:     Effort: Pulmonary effort is normal. No respiratory distress.     Breath sounds: Normal breath sounds.  Skin:    General: Skin is warm and dry.  Neurological:     Mental Status: He is alert and oriented to person, place, and time.  Psychiatric:        Mood and Affect: Mood normal.        Behavior: Behavior normal.        Thought Content: Thought content normal.        Judgment: Judgment normal.    Assessment & Plan:   1. Encounter to establish care Reviewed available information and discussed care concerns with patient.   2. Primary hypertension Checking labs as below.  Blood pressure looks good today on current regimen.  Continue lisinopril, Toprol-XL, and hydrochlorothiazide as prescribed.  Recommend a low-sodium diet, regular intentional exercise, and weight loss to healthy weight. - CBC with Differential/Platelet - COMPLETE METABOLIC PANEL WITH GFR - Lipid panel  3. Obstructive sleep apnea syndrome Continue nightly CPAP use.  Let me know if any supplies or new prescriptions are needed and I will look glad to handle this.  If adjustments are needed for settings, we will get him referred over to sleep medicine.  4. Cigarette nicotine dependence, uncomplicated Recommendations for lung cancer screening.  He is amenable to this to ordering CT chest for lung cancer screening today.  Recommend smoking cessation.  If patient desires, we can certainly discuss  alternatives such as nicotine patches, gums, lozenges, Wellbutrin, or Chantix. - CT CHEST LUNG CA SCREEN LOW DOSE W/O CM; Future  5. Class 2 severe obesity due to excess calories with serious comorbidity and body mass index (BMI) of 35.0 to 35.9 in adult [facility]) Checking hemoglobin A1c and TSH today. - Hemoglobin A1c - TSH  Outpatient Encounter Medications as of 03/19/2022  Medication Sig   methylPREDNISolone (MEDROL DOSEPAK) 4 MG TBPK tablet tad   [DISCONTINUED] cyclobenzaprine (FLEXERIL) 10 MG tablet Take 1 tablet (10 mg total) by mouth 2 (two) times daily as  needed for muscle spasms.   [DISCONTINUED] doxycycline (VIBRAMYCIN) 100 MG capsule Take 1 capsule (100 mg total) by mouth 2 (two) times daily.   [DISCONTINUED] EPINEPHrine 0.3 mg/0.3 mL IJ SOAJ injection Inject into the muscle.   [DISCONTINUED] hydrochlorothiazide (HYDRODIURIL) 25 MG tablet Take by mouth.   [DISCONTINUED] HYDROcodone-acetaminophen (NORCO) 7.5-325 MG tablet Take 1 tablet by mouth every 6 (six) hours as needed for moderate pain.   [DISCONTINUED] lisinopril (ZESTRIL) 40 MG tablet SMARTSIG:1 Tablet(s) By Mouth Daily   [DISCONTINUED] metoprolol succinate (TOPROL-XL) 100 MG 24 hr tablet Take 1 tablet by mouth daily.   [DISCONTINUED] sildenafil (REVATIO) 20 MG tablet Take 20 mg by mouth daily as needed.   EPINEPHrine 0.3 mg/0.3 mL IJ SOAJ injection Inject 0.3 mg into the muscle as needed for anaphylaxis.   hydrochlorothiazide (HYDRODIURIL) 25 MG tablet Take 1 tablet (25 mg total) by mouth daily.   lisinopril (ZESTRIL) 40 MG tablet Take 1 tablet (40 mg total) by mouth daily.   metoprolol succinate (TOPROL-XL) 100 MG 24 hr tablet Take 1 tablet (100 mg total) by mouth daily.   sildenafil (REVATIO) 20 MG tablet Take 1 tablet (20 mg total) by mouth daily as needed.   No facility-administered encounter medications on file as of 03/19/2022.    Follow-up: Return in about 6 months (around 09/17/2022) for chronic disease follow up.    Clearnce Sorrel, DNP, APRN, FNP-BC [facility]

## 2022-03-20 LAB — COMPLETE METABOLIC PANEL WITH GFR
AG Ratio: 1.7 (calc) (ref 1.0–2.5)
ALT: 25 U/L (ref 9–46)
AST: 16 U/L (ref 10–35)
Albumin: 4.3 g/dL (ref 3.6–5.1)
Alkaline phosphatase (APISO): 54 U/L (ref 35–144)
BUN/Creatinine Ratio: 15 (calc) (ref 6–22)
BUN: 22 mg/dL (ref 7–25)
CO2: 27 mmol/L (ref 20–32)
Calcium: 9.2 mg/dL (ref 8.6–10.3)
Chloride: 105 mmol/L (ref 98–110)
Creat: 1.46 mg/dL — ABNORMAL HIGH (ref 0.70–1.30)
Globulin: 2.5 g/dL (calc) (ref 1.9–3.7)
Glucose, Bld: 189 mg/dL — ABNORMAL HIGH (ref 65–99)
Potassium: 4.2 mmol/L (ref 3.5–5.3)
Sodium: 142 mmol/L (ref 135–146)
Total Bilirubin: 0.3 mg/dL (ref 0.2–1.2)
Total Protein: 6.8 g/dL (ref 6.1–8.1)
eGFR: 56 mL/min/{1.73_m2} — ABNORMAL LOW (ref >=60)

## 2022-03-20 LAB — CBC WITH DIFFERENTIAL/PLATELET
Absolute Monocytes: 507 cells/uL (ref 200–950)
Basophils Absolute: 39 cells/uL (ref 0–200)
Basophils Relative: 0.5 %
Eosinophils Absolute: 257 cells/uL (ref 15–500)
Eosinophils Relative: 3.3 %
HCT: 43.4 % (ref 38.5–50.0)
Hemoglobin: 14.6 g/dL (ref 13.2–17.1)
Lymphs Abs: 2161 cells/uL (ref 850–3900)
MCH: 32.2 pg (ref 27.0–33.0)
MCHC: 33.6 g/dL (ref 32.0–36.0)
MCV: 95.6 fL (ref 80.0–100.0)
MPV: 10.5 fL (ref 7.5–12.5)
Monocytes Relative: 6.5 %
Neutro Abs: 4836 cells/uL (ref 1500–7800)
Neutrophils Relative %: 62 %
Platelets: 280 10*3/uL (ref 140–400)
RBC: 4.54 10*6/uL (ref 4.20–5.80)
RDW: 12.3 % (ref 11.0–15.0)
Total Lymphocyte: 27.7 %
WBC: 7.8 10*3/uL (ref 3.8–10.8)

## 2022-03-20 LAB — LIPID PANEL
Cholesterol: 208 mg/dL — ABNORMAL HIGH (ref <200)
HDL: 35 mg/dL — ABNORMAL LOW (ref >=40)
LDL Cholesterol (Calc): 136 mg/dL (calc) — ABNORMAL HIGH
Non-HDL Cholesterol (Calc): 173 mg/dL (calc) — ABNORMAL HIGH (ref <130)
Total CHOL/HDL Ratio: 5.9 (calc) — ABNORMAL HIGH (ref <5.0)
Triglycerides: 227 mg/dL — ABNORMAL HIGH (ref <150)

## 2022-03-20 LAB — HEMOGLOBIN A1C
Hgb A1c MFr Bld: 6.8 % of total Hgb — ABNORMAL HIGH (ref <5.7)
Mean Plasma Glucose: 148 mg/dL
eAG (mmol/L): 8.2 mmol/L

## 2022-03-20 LAB — TSH: TSH: 1.1 mIU/L (ref 0.40–4.50)

## 2022-03-25 ENCOUNTER — Encounter: Payer: Self-pay | Admitting: Medical-Surgical

## 2022-03-25 ENCOUNTER — Telehealth (INDEPENDENT_AMBULATORY_CARE_PROVIDER_SITE_OTHER): Payer: 59 | Admitting: Medical-Surgical

## 2022-03-25 DIAGNOSIS — E119 Type 2 diabetes mellitus without complications: Secondary | ICD-10-CM | POA: Diagnosis not present

## 2022-03-25 MED ORDER — LANCET DEVICE MISC: 1.0000 | Freq: Three times a day (TID) | 0 refills | Status: AC

## 2022-03-25 MED ORDER — LANCETS MISC. MISC
1.0000 | Freq: Three times a day (TID) | 0 refills | Status: AC
Start: 2022-03-25 — End: 2022-04-24

## 2022-03-25 MED ORDER — ROSUVASTATIN CALCIUM 10 MG PO TABS: 10.0000 mg | ORAL_TABLET | Freq: Every day | ORAL | 3 refills | Status: DC

## 2022-03-25 MED ORDER — BLOOD GLUCOSE MONITORING SUPPL DEVI: 1.0000 | Freq: Three times a day (TID) | 0 refills | Status: AC

## 2022-03-25 MED ORDER — BLOOD GLUCOSE TEST VI STRP: 1.0000 | ORAL_STRIP | Freq: Three times a day (TID) | 0 refills | Status: AC

## 2022-03-25 MED ORDER — METFORMIN HCL ER 500 MG PO TB24: 500.0000 mg | ORAL_TABLET | Freq: Every day | ORAL | 1 refills | Status: DC

## 2022-03-25 NOTE — Progress Notes (Signed)
Virtual Visit via Video Note  I connected with Jeremy Melton on 03/25/22 at 10:30 AM EST by a video enabled telemedicine application and verified that I am speaking with the correct person using two identifiers.   I discussed the limitations of evaluation and management by telemedicine and the availability of in person appointments. The patient expressed understanding and agreed to proceed.  Patient location: home Provider locations: office  Subjective:    CC: Discuss lab results  HPI: Pleasant 57 year old male presenting via MyChart video visit to discuss lab results.  His A1c came back at 6.8% indicating that he is a new diagnosis type II diabetic.  Does not have a glucometer at home and has not been following any particular diet.  Notes that his wife is going to put him on a diet now that we know he is diabetic.  His last eye exam was approximately 3 years ago.  Past medical history, Surgical history, Family history not pertinant except as noted below, Social history, Allergies, and medications have been entered into the medical record, reviewed, and corrections made.   Review of Systems: See HPI for pertinent positives and negatives.   Objective:    General: Speaking clearly in complete sentences without any shortness of breath.  Alert and oriented x3.  Normal judgment. No apparent acute distress.  Impression and Recommendations:    1. Controlled type 2 diabetes mellitus without complication, without long-term current use of insulin (HCC) Starting metformin XR 500 mg daily.  Prescription sent for glucometer with instructions to check sugars 2-3 times weekly fasting with a goal of 120 or less.  Referring to diabetic education.  Recommend updating an eye exam and make sure that they complete a diabetic eye exam.  Continue lisinopril for kidney protection and blood pressure support.  Starting Crestor 10 mg daily for cardiovascular protection and cholesterol management. - Ambulatory  referral to diabetic education  I discussed the assessment and treatment plan with the patient. The patient was provided an opportunity to ask questions and all were answered. The patient agreed with the plan and demonstrated an understanding of the instructions.   The patient was advised to call back or seek an in-person evaluation if the symptoms worsen or if the condition fails to improve as anticipated.  25 minutes of non-face-to-face time was provided during this encounter.  Return in about 3 months (around 06/23/2022) for DM follow up.  Clearnce Sorrel, DNP, APRN, FNP-BC [facility]

## 2022-04-09 ENCOUNTER — Ambulatory Visit (INDEPENDENT_AMBULATORY_CARE_PROVIDER_SITE_OTHER): Payer: 59

## 2022-04-09 DIAGNOSIS — F17210 Nicotine dependence, cigarettes, uncomplicated: Secondary | ICD-10-CM

## 2022-04-12 ENCOUNTER — Encounter: Payer: Self-pay | Admitting: Medical-Surgical

## 2022-04-12 DIAGNOSIS — I700 Atherosclerosis of aorta: Secondary | ICD-10-CM | POA: Insufficient documentation

## 2022-04-12 DIAGNOSIS — J432 Centrilobular emphysema: Secondary | ICD-10-CM | POA: Insufficient documentation

## 2022-04-13 ENCOUNTER — Encounter: Payer: Self-pay | Admitting: Medical-Surgical

## 2022-04-13 DIAGNOSIS — K2289 Other specified disease of esophagus: Secondary | ICD-10-CM

## 2022-04-13 DIAGNOSIS — R9389 Abnormal findings on diagnostic imaging of other specified body structures: Secondary | ICD-10-CM

## 2022-04-30 ENCOUNTER — Encounter: Payer: Self-pay | Admitting: Medical-Surgical

## 2022-05-29 ENCOUNTER — Ambulatory Visit (INDEPENDENT_AMBULATORY_CARE_PROVIDER_SITE_OTHER): Payer: 59

## 2022-05-29 ENCOUNTER — Ambulatory Visit
Admission: EM | Admit: 2022-05-29 | Discharge: 2022-05-29 | Disposition: A | Discharge status: Home / Self Care | Payer: 59 | Attending: Family Medicine | Admitting: Family Medicine

## 2022-05-29 ENCOUNTER — Other Ambulatory Visit: Payer: Self-pay

## 2022-05-29 DIAGNOSIS — J40 Bronchitis, not specified as acute or chronic: Secondary | ICD-10-CM

## 2022-05-29 DIAGNOSIS — R058 Other specified cough: Secondary | ICD-10-CM | POA: Diagnosis not present

## 2022-05-29 DIAGNOSIS — R059 Cough, unspecified: Secondary | ICD-10-CM

## 2022-05-29 MED ORDER — BENZONATATE 200 MG PO CAPS: 200.0000 mg | ORAL_CAPSULE | Freq: Three times a day (TID) | ORAL | 0 refills | Status: AC | PRN

## 2022-05-29 MED ORDER — METHYLPREDNISOLONE ACETATE 80 MG/ML IJ SUSP
80.0000 mg | Freq: Once | INTRAMUSCULAR | Status: AC
  Administered 2022-05-29: 80 mg via INTRAMUSCULAR

## 2022-05-29 MED ORDER — PROMETHAZINE-DM 6.25-15 MG/5ML PO SYRP: 5.0000 mL | ORAL_SOLUTION | Freq: Two times a day (BID) | ORAL | 0 refills | Status: DC | PRN

## 2022-05-29 MED ORDER — PREDNISONE 10 MG (21) PO TBPK: ORAL_TABLET | Freq: Every day | ORAL | 0 refills | Status: DC

## 2022-05-29 MED ORDER — DOXYCYCLINE HYCLATE 100 MG PO CAPS: 100.0000 mg | ORAL_CAPSULE | Freq: Two times a day (BID) | ORAL | 0 refills | Status: AC

## 2022-05-29 NOTE — Discharge Instructions (Addendum)
Instructed patient to take medications as directed with food to completion.  Advised patient to take prednisone with first dose of Doxycycline for the next 10 days.  Advised may use Tessalon daily or as needed for cough.  Advised may use Promethazine DM at night prior to sleep for cough due to sedative effects.  Advised patient not to use medications together.  Encouraged increase daily water intake to 64 ounces per day while taking these medications.  Advised if symptoms worsen and/or unresolved please follow-up with PCP or here for further evaluation.

## 2022-05-29 NOTE — ED Provider Notes (Signed)
[facility]    CSN: [id number] Arrival date & time: 05/29/22  0807      History   Chief Complaint Chief Complaint  Patient presents with   Cough   Nasal Congestion   Sore Throat    HPI Jane Scroggin is a 57 y.o. male.   HPI Very pleasant 56 year old male presents with sore throat, nasal congestion, cough for 4 days.  Reports history of bronchitis PMH significant for morbid obesity, HTN, and sleep apnea.  Past Medical History:  Diagnosis Date   Bronchitis    Hypertension    Sleep apnea     Patient Active Problem List   Diagnosis Date Noted   Centrilobular emphysema (HCC) 04/12/2022   Aortic atherosclerosis (HCC) 04/12/2022   Plantar fasciitis 03/19/2022   Anal fissure 10/06/2021   Grade III hemorrhoids 02/28/2019   Bleeding internal hemorrhoids 12/13/2018   Encounter for screening colonoscopy 12/13/2018   Cigarette nicotine dependence, uncomplicated 06/11/2015   Insomnia 06/11/2015   Sleep apnea 09/06/2013   Arch pain of right foot 08/21/2013   Class 2 severe obesity due to excess calories with serious comorbidity and body mass index (BMI) of 35.0 to 35.9 in adult [facility]) 04/18/2013   HTN (hypertension) 04/18/2013    Past Surgical History:  Procedure Laterality Date   CHOLECYSTECTOMY     NASAL SINUS SURGERY         Home Medications    Prior to Admission medications   Medication Sig Start Date End Date Taking? Authorizing Provider  benzonatate (TESSALON) 200 MG capsule Take 1 capsule (200 mg total) by mouth 3 (three) times daily as needed for up to 7 days. 05/29/22 06/05/22 Yes Trevor Iha, FNP  doxycycline (VIBRAMYCIN) 100 MG capsule Take 1 capsule (100 mg total) by mouth 2 (two) times daily for 10 days. 05/29/22 06/08/22 Yes Trevor Iha, FNP  predniSONE (STERAPRED UNI-PAK 21 TAB) 10 MG (21) TBPK tablet Take by mouth daily. Take 6 tabs by mouth daily  for 2 days, then 5 tabs for 2 days, then 4 tabs for 2 days, then 3 tabs for 2 days, 2 tabs for  2 days, then 1 tab by mouth daily for 2 days 05/29/22  Yes Trevor Iha, FNP  promethazine-dextromethorphan (PROMETHAZINE-DM) 6.25-15 MG/5ML syrup Take 5 mLs by mouth 2 (two) times daily as needed for cough. 05/29/22  Yes Trevor Iha, FNP  Blood Glucose Monitoring Suppl DEVI 1 each by Does not apply route in the morning, at noon, and at bedtime. May substitute to any manufacturer covered by patient's insurance. 03/25/22   Christen Butter, NP  EPINEPHrine 0.3 mg/0.3 mL IJ SOAJ injection Inject 0.3 mg into the muscle as needed for anaphylaxis. 03/19/22   Christen Butter, NP  hydrochlorothiazide (HYDRODIURIL) 25 MG tablet Take 1 tablet (25 mg total) by mouth daily. 03/19/22   Christen Butter, NP  lisinopril (ZESTRIL) 40 MG tablet Take 1 tablet (40 mg total) by mouth daily. 03/19/22   Christen Butter, NP  metFORMIN (GLUCOPHAGE-XR) 500 MG 24 hr tablet Take 1 tablet (500 mg total) by mouth daily with breakfast. 03/25/22   Christen Butter, NP  metoprolol succinate (TOPROL-XL) 100 MG 24 hr tablet Take 1 tablet (100 mg total) by mouth daily. 03/19/22   Christen Butter, NP  rosuvastatin (CRESTOR) 10 MG tablet Take 1 tablet (10 mg total) by mouth daily. 03/25/22   Christen Butter, NP  sildenafil (REVATIO) 20 MG tablet Take 1 tablet (20 mg total) by mouth daily as needed. 03/19/22   Jessup,  Joy, NP    Family History Family History  Problem Relation Age of Onset   Hypertension Mother    Hypertension Father    Heart failure Father    Cancer Father        kidney   Hypertension Brother     Social History Social History   Tobacco Use   Smoking status: Every Day    Packs/day: 1.00    Years: 30.00    Additional pack years: 0.00    Total pack years: 30.00    Types: Cigarettes   Smokeless tobacco: Never  Vaping Use   Vaping Use: Never used  Substance Use Topics   Alcohol use: No   Drug use: No     Allergies   Bee venom, Loratadine, Allegra [fexofenadine hcl], and Zyrtec [cetirizine]   Review of Systems Review of Systems   HENT:  Positive for congestion and sore throat.   Respiratory:  Positive for cough.   All other systems reviewed and are negative.    Physical Exam Triage Vital Signs ED Triage Vitals  Enc Vitals Group     BP 05/29/22 0826 127/85     Pulse Rate 05/29/22 0826 79     Resp 05/29/22 0826 17     Temp 05/29/22 0826 98.4 F (36.9 C)     Temp Source 05/29/22 0826 Oral     SpO2 05/29/22 0826 97 %     Weight --      Height --      Head Circumference --      Peak Flow --      Pain Score 05/29/22 0827 0     Pain Loc --      Pain Edu? --      Excl. in GC? --    No data found.  Updated Vital Signs BP 127/85 (BP Location: Left Arm)   Pulse 79   Temp 98.4 F (36.9 C) (Oral)   Resp 17   SpO2 97%       Physical Exam Vitals and nursing note reviewed.  Constitutional:      Appearance: He is obese. He is ill-appearing.  HENT:     Head: Normocephalic and atraumatic.     Right Ear: Tympanic membrane, ear canal and external ear normal.     Left Ear: Tympanic membrane, ear canal and external ear normal.     Mouth/Throat:     Mouth: Mucous membranes are moist.     Pharynx: Oropharynx is clear. Uvula midline.  Eyes:     Conjunctiva/sclera: Conjunctivae normal.     Pupils: Pupils are equal, round, and reactive to light.  Cardiovascular:     Rate and Rhythm: Normal rate and regular rhythm.     Pulses: Normal pulses.     Heart sounds: Normal heart sounds. No murmur heard. Pulmonary:     Effort: Pulmonary effort is normal.     Breath sounds: Wheezing and rhonchi present. No rales.     Comments: Diffuse scattered rhonchi with audible wheezing noted throughout; diminished breath sounds bibasilarly, frequent nonproductive cough on exam Musculoskeletal:     Cervical back: Normal range of motion and neck supple. No tenderness.  Lymphadenopathy:     Cervical: No cervical adenopathy.  Neurological:     General: No focal deficit present.     Mental Status: He is alert and oriented to  person, place, and time.      UC Treatments / Results  Labs (all labs ordered are listed, but only  abnormal results are displayed) Labs Reviewed - No data to display  EKG   Radiology DG Chest 2 View  Result Date: 05/29/2022 CLINICAL DATA:  Cough for 7 days. EXAM: CHEST - 2 VIEW COMPARISON:  01/22/2011. FINDINGS: Normal heart, mediastinum and hila. Clear lungs.  No pleural effusion or pneumothorax. Skeletal structures are intact. IMPRESSION: No active cardiopulmonary disease. Electronically Signed   By: Amie Portland M.D.   On: 05/29/2022 08:53    Procedures Procedures (including critical care time)  Medications Ordered in UC Medications  methylPREDNISolone acetate (DEPO-MEDROL) injection 80 mg (80 mg Intramuscular Given 05/29/22 0923)    Initial Impression / Assessment and Plan / UC Course  I have reviewed the triage vital signs and the nursing notes.  Pertinent labs & imaging results that were available during my care of the patient were reviewed by me and considered in my medical decision making (see chart for details).     MDM: 1.  Cough unspecified type-CXR revealed above, Rx'd doxycycline 100 mg twice daily x 10 days, Tessalon 200 mg 3 times daily, as needed, Promethazine DM 6.25-15 mg / 5 mL take 5 mL twice daily for cough, as needed; 2.  Bronchitis-IM Depo-Medrol 80 mg given once in clinic, Rx'd Sterapred Unipak (tapering from 60 mg to 10 mg over 10 days. Instructed patient to take medications as directed with food to completion.  Advised patient to take prednisone with first dose of Doxycycline for the next 10 days.  Advised may use Tessalon daily or as needed for cough.  Advised may use Promethazine DM at night prior to sleep for cough due to sedative effects.  Advised patient not to use medications together.  Encouraged increase daily water intake to 64 ounces per day while taking these medications.  Advised if symptoms worsen and/or unresolved please follow-up with PCP or  here for further evaluation.  Patient discharged home, hemodynamically stable. Final Clinical Impressions(s) / UC Diagnoses   Final diagnoses:  Cough, unspecified type  Bronchitis     Discharge Instructions      Instructed patient to take medications as directed with food to completion.  Advised patient to take prednisone with first dose of Doxycycline for the next 10 days.  Advised may use Tessalon daily or as needed for cough.  Advised may use Promethazine DM at night prior to sleep for cough due to sedative effects.  Advised patient not to use medications together.  Encouraged increase daily water intake to 64 ounces per day while taking these medications.  Advised if symptoms worsen and/or unresolved please follow-up with PCP or here for further evaluation.      ED Prescriptions     Medication Sig Dispense Auth. Provider   doxycycline (VIBRAMYCIN) 100 MG capsule Take 1 capsule (100 mg total) by mouth 2 (two) times daily for 10 days. 20 capsule Trevor Iha, FNP   predniSONE (STERAPRED UNI-PAK 21 TAB) 10 MG (21) TBPK tablet Take by mouth daily. Take 6 tabs by mouth daily  for 2 days, then 5 tabs for 2 days, then 4 tabs for 2 days, then 3 tabs for 2 days, 2 tabs for 2 days, then 1 tab by mouth daily for 2 days 42 tablet Trevor Iha, FNP   benzonatate (TESSALON) 200 MG capsule Take 1 capsule (200 mg total) by mouth 3 (three) times daily as needed for up to 7 days. 40 capsule Trevor Iha, FNP   promethazine-dextromethorphan (PROMETHAZINE-DM) 6.25-15 MG/5ML syrup Take 5 mLs by mouth 2 (two) times  daily as needed for cough. 118 mL Trevor Iha, FNP      PDMP not reviewed this encounter.   Trevor Iha, FNP 05/29/22 [phone]

## 2022-05-29 NOTE — ED Triage Notes (Addendum)
Pt c/o cough, congestion and sore throat x 3-4 days.  Denies fever over 100.4. Tylenol prn. Hx of bronchitis.

## 2022-06-24 ENCOUNTER — Ambulatory Visit: Payer: 59 | Admitting: Medical-Surgical

## 2022-06-24 ENCOUNTER — Encounter: Payer: Self-pay | Admitting: Medical-Surgical

## 2022-06-24 VITALS — BP 118/75 | HR 65 | Resp 20 | Ht 72.0 in | Wt 269.3 lb

## 2022-06-24 DIAGNOSIS — E119 Type 2 diabetes mellitus without complications: Secondary | ICD-10-CM | POA: Diagnosis not present

## 2022-06-24 DIAGNOSIS — I700 Atherosclerosis of aorta: Secondary | ICD-10-CM | POA: Diagnosis not present

## 2022-06-24 DIAGNOSIS — Z23 Encounter for immunization: Secondary | ICD-10-CM

## 2022-06-24 DIAGNOSIS — J432 Centrilobular emphysema: Secondary | ICD-10-CM

## 2022-06-24 LAB — POCT GLYCOSYLATED HEMOGLOBIN (HGB A1C)
HbA1c, POC (controlled diabetic range): 7 % (ref 0.0–7.0)
Hemoglobin A1C: 7 % — AB (ref 4.0–5.6)

## 2022-06-24 LAB — POCT UA - MICROALBUMIN
Albumin/Creatinine Ratio, Urine, POC: <30
Creatinine, POC: 200 mg/dL
Microalbumin Ur, POC: 10 mg/L

## 2022-06-24 MED ORDER — METFORMIN HCL ER 500 MG PO TB24: 500.0000 mg | ORAL_TABLET | Freq: Two times a day (BID) | ORAL | 1 refills | Status: DC

## 2022-06-24 NOTE — Addendum Note (Signed)
Addended by: Latanya Presser on: 06/24/2022 11:25 AM   Modules accepted: Orders

## 2022-06-24 NOTE — Progress Notes (Signed)
        Established patient visit  History, exam, impression, and plan:  1. Controlled type 2 diabetes mellitus without complication, without long-term current use of insulin [facility]) Pleasant 57 year old male presenting today for follow-up on type 2 diabetes.  Has been taking metformin 500 mg daily, tolerating well without side effects.  Hemoglobin A1c when checked in February at 6.8%.  Notes that he has been checking his sugars approximately 3 times weekly and fasting readings average around 120.  Did have a few weeks where his sugars were higher in the 150s but this was when his mother was in the hospital and actively dying.  Admits that he has not made many dietary changes so far.  Recheck of hemoglobin A1c today at 7.0%.  Increasing metformin to 500 mg twice daily.  Resending referral to diabetic education as this was closed due to difficulty scheduling.  Urine microalbumin completed today. - POCT HgB A1C - metFORMIN (GLUCOPHAGE-XR) 500 MG 24 hr tablet; Take 1 tablet (500 mg total) by mouth 2 (two) times daily with a meal.  Dispense: 180 tablet; Refill: 1  2. Centrilobular emphysema (HCC) Discussed his recent findings of central lobar emphysema.  He continues smoking but has cut back drastically and has plans to continue trying to cut back further and eventually stop.  Has questions regarding symptoms to monitor for as well as disease progression.  Advised that this does place him at higher risk for respiratory infections.  Strongly recommend smoking cessation and working on regular intentional exercise.  3. Aortic atherosclerosis (HCC) Findings of aortic atherosclerosis on CT.  Discussed the indication of this and the need for tight control of hyperlipidemia.  Reviewed recommendations for a low-fat heart healthy diet, exercise, and weight loss to healthy weight.  Discussed coronary calcium score in the future should he decide he would like further investigation.  4. Need for shingles  vaccine Unsure if he wants to proceed with the shingles vaccine today.  We did review recommendations for Shingrix x 2 doses and the timeline for these.  Briefly discussed expected side effects and the risks of getting shingles if unvaccinated.  He will consider this and let us know.  If he decides to proceed, okay to schedule as a nurse visit.  Procedures performed this visit: None.  Return in about 3 months (around 09/24/2022) for DM follow up.  __________________________________ Thayer Ohm, DNP, APRN, FNP-BC Primary Care and Sports Medicine [facility] [place]

## 2022-09-17 ENCOUNTER — Ambulatory Visit: Payer: 59 | Admitting: Medical-Surgical

## 2022-09-19 ENCOUNTER — Other Ambulatory Visit: Payer: Self-pay | Admitting: Medical-Surgical

## 2022-09-19 DIAGNOSIS — E119 Type 2 diabetes mellitus without complications: Secondary | ICD-10-CM

## 2022-09-24 ENCOUNTER — Ambulatory Visit: Payer: 59 | Admitting: Medical-Surgical

## 2022-09-24 ENCOUNTER — Encounter: Payer: Self-pay | Admitting: Medical-Surgical

## 2022-09-24 VITALS — BP 117/72 | HR 64 | Resp 20 | Ht 72.0 in | Wt 270.1 lb

## 2022-09-24 DIAGNOSIS — I10 Essential (primary) hypertension: Secondary | ICD-10-CM | POA: Diagnosis not present

## 2022-09-24 DIAGNOSIS — J432 Centrilobular emphysema: Secondary | ICD-10-CM | POA: Diagnosis not present

## 2022-09-24 DIAGNOSIS — I700 Atherosclerosis of aorta: Secondary | ICD-10-CM | POA: Diagnosis not present

## 2022-09-24 DIAGNOSIS — E119 Type 2 diabetes mellitus without complications: Secondary | ICD-10-CM

## 2022-09-24 LAB — POCT GLYCOSYLATED HEMOGLOBIN (HGB A1C)
HbA1c, POC (controlled diabetic range): 6.3 % (ref 0.0–7.0)
Hemoglobin A1C: 6.3 % — AB (ref 4.0–5.6)

## 2022-09-24 NOTE — Progress Notes (Signed)
        Established patient visit  History, exam, impression, and plan:  1. Primary hypertension Pleasant 57 year old male presenting today with a history of hypertension.  Currently managed with Toprol XL 100 mg daily, lisinopril 40 mg daily, and hydrochlorothiazide 25 mg daily.  Tolerating all medications well without side effects.  Denies concerning symptoms today.  Cardiopulmonary exam normal.  Blood pressure at goal.  Continue Toprol-XL, lisinopril, and HCTZ as prescribed.  2. Aortic atherosclerosis (HCC) Currently being treated with rosuvastatin 10 mg daily, tolerating well without side effects.  Aware of recommendations for increased exercise, weight loss, and a low-fat heart healthy diet.  3. Centrilobular emphysema (HCC) Continues to smoke but this has become more intermittent rather than every day.  He is working on cutting back further with the eventual goal for quitting.  Lungs CTA, respirations even and unlabored.  No acute distress.  4. Controlled type 2 diabetes mellitus without complication, without long-term current use of insulin (HCC) Taking metformin 500 mg twice daily, tolerating well without side effects.  Intermittently checking blood sugars but not doing so regularly.  Does admit to eating a lot of pasta.  Recheck of hemoglobin A1c at 6.3% today showing excellent control.  Continue metformin twice daily as prescribed.  Continue monitoring fasting glucose with a goal of 90-120.  Work on incorporating exercise, weight loss, and a low-carb diet. - POCT HgB A1C  Procedures performed this visit: None.  Return in about 6 months (around 03/25/2023) for DM/HTN/HLD follow up.  __________________________________ Thayer Ohm, DNP, APRN, FNP-BC Primary Care and Sports Medicine [facility] [place]

## 2023-01-20 ENCOUNTER — Other Ambulatory Visit: Payer: Self-pay | Admitting: Medical-Surgical

## 2023-01-20 DIAGNOSIS — E119 Type 2 diabetes mellitus without complications: Secondary | ICD-10-CM

## 2023-03-20 ENCOUNTER — Other Ambulatory Visit: Payer: Self-pay | Admitting: Medical-Surgical

## 2023-03-24 ENCOUNTER — Encounter: Payer: Self-pay | Admitting: Medical-Surgical

## 2023-03-25 ENCOUNTER — Ambulatory Visit: Payer: 59 | Admitting: Medical-Surgical

## 2023-03-29 ENCOUNTER — Ambulatory Visit: Payer: 59 | Admitting: Medical-Surgical

## 2023-03-29 ENCOUNTER — Encounter: Payer: Self-pay | Admitting: Medical-Surgical

## 2023-03-29 VITALS — BP 124/79 | HR 62 | Resp 20 | Ht 72.0 in | Wt 262.7 lb

## 2023-03-29 DIAGNOSIS — I700 Atherosclerosis of aorta: Secondary | ICD-10-CM | POA: Diagnosis not present

## 2023-03-29 DIAGNOSIS — I10 Essential (primary) hypertension: Secondary | ICD-10-CM | POA: Diagnosis not present

## 2023-03-29 DIAGNOSIS — F17210 Nicotine dependence, cigarettes, uncomplicated: Secondary | ICD-10-CM

## 2023-03-29 DIAGNOSIS — E119 Type 2 diabetes mellitus without complications: Secondary | ICD-10-CM | POA: Diagnosis not present

## 2023-03-29 LAB — POCT GLYCOSYLATED HEMOGLOBIN (HGB A1C)
HbA1c, POC (controlled diabetic range): 6.4 % (ref 0.0–7.0)
Hemoglobin A1C: 6.4 % — AB (ref 4.0–5.6)

## 2023-03-29 MED ORDER — METOPROLOL SUCCINATE ER 100 MG PO TB24: 100.0000 mg | ORAL_TABLET | Freq: Every day | ORAL | 3 refills | Status: AC

## 2023-03-29 MED ORDER — LISINOPRIL 40 MG PO TABS: 40.0000 mg | ORAL_TABLET | Freq: Every day | ORAL | 3 refills | Status: AC

## 2023-03-29 MED ORDER — HYDROCHLOROTHIAZIDE 25 MG PO TABS: 25.0000 mg | ORAL_TABLET | Freq: Every day | ORAL | 3 refills | Status: DC

## 2023-03-29 MED ORDER — METFORMIN HCL ER 500 MG PO TB24: 500.0000 mg | ORAL_TABLET | Freq: Every day | ORAL | 1 refills | Status: DC

## 2023-03-29 MED ORDER — ROSUVASTATIN CALCIUM 10 MG PO TABS: 10.0000 mg | ORAL_TABLET | Freq: Every day | ORAL | 3 refills | Status: AC

## 2023-03-29 NOTE — Progress Notes (Unsigned)
 Subjective:  Patient ID: Jeremy Melton, male    DOB: Mar 06, 1965, 58 y.o.   MRN: [id number]  Patient Care Team: Christen Butter, NP as PCP - General (Nurse Practitioner)   Chief Complaint:  Diabetes   HPI:  Jeremy Melton is a 58 y.o. male presenting on 03/29/2023 for Diabetes   History, Exam,  Impression and Plan  1. Controlled type 2 diabetes mellitus without complication, without long-term current use of insulin (HCC) (Primary) 58 year old male presents for diabetes management.  Patient changed diet to a low glycemic approximately 1 month ago and has eliminated soda and processed sugars and started "eating diabetic".  Shortly after starting diet started experiencing upper abdominal pain approximately 2 hours after taking metformin in the morning and occasionally at night.  States he feels was caused by metformin and subsequently 1 week ago decreased from 500 mg BID to once daily.  Has not had abdominal pain & diarrhea since.  A1c today is 6.6 slightly up from 6.5 last visit. Will continue metformin 500mg  PO daily for 90 days until next Hgb A1c check. - POCT HgB A1C - CMP14+EGFR - metFORMIN (GLUCOPHAGE-XR) 500 MG 24 hr tablet; Take 1 tablet (500 mg total) by mouth daily with breakfast.  Dispense: 90 tablet; Refill: 1 - Vitamin B12  2. Primary hypertension Blood pressure slightly elevated on arrival at 140s systolic.  Recheck was 124/79 with heart rate of 62.  Does not check blood pressure at home patient taking lisinopril 40 mg daily, metoprolol 100 mg daily, HCTZ 25 mg daily as directed without side effects.  Denies blurry vision, chest pain, left arm pain, lower extremity edema or palpitations.  HTN is well managed. Continue medication regimen - CMP14+EGFR - CBC - Lipid panel - hydrochlorothiazide (HYDRODIURIL) 25 MG tablet; Take 1 tablet (25 mg total) by mouth daily.  Dispense: 90 tablet; Refill: 3 - lisinopril (ZESTRIL) 40 MG tablet; Take 1 tablet (40 mg total) by mouth daily.   Dispense: 90 tablet; Refill: 3 - metoprolol succinate (TOPROL-XL) 100 MG 24 hr tablet; Take 1 tablet (100 mg total) by mouth daily.  Dispense: 90 tablet; Refill: 3   3. Aortic atherosclerosis (HCC) Currently taking as rosuvastatin 10 mg daily as directed and is tolerating well without side effects. Aware of recommendations for increased exercise, weight loss, and a low-fat heart healthy diet. Continue Crestor The 10-year ASCVD risk score (Arnett DK, et al., 2019) is: 32.3%   Values used to calculate the score:     Age: 79 years     Sex: Male     Is Non-Hispanic African American: No     Diabetic: Yes     Tobacco smoker: Yes     Systolic Blood Pressure: 124 mmHg     Is BP treated: Yes     HDL Cholesterol: 35 mg/dL     Total Cholesterol: 208 mg/dL - rosuvastatin (CRESTOR) 10 MG tablet; Take 1 tablet (10 mg total) by mouth daily.  Dispense: 90 tablet; Refill: 3   4. Cigarette nicotine dependence, uncomplicated Patient is ongoing 1 pack a day smoker and is over 30 years of age with 50 pack/year history of smoking.  With history of emphysema last chest CT was May 2024 - CT CHEST LUNG CA SCREEN LOW DOSE W/O CM; Future   Continue all other maintenance medications   Follow up plan: Return in about 6 months (around 09/29/2023) for DM/HTN/HLD follow up. Continue metformin 500mg  PO daily for 90 days until next Hgb A1c check  and evaluate at that time for increasing metformin to 750mg  PO daily.   Relevant past medical, surgical, family, and social history reviewed and updated as indicated.  Allergies and medications reviewed and updated. Data reviewed: Chart in Epic.   Past Medical History:  Diagnosis Date   Bronchitis    Hypertension    Sleep apnea     Past Surgical History:  Procedure Laterality Date   CHOLECYSTECTOMY     NASAL SINUS SURGERY      Social History   Socioeconomic History   Marital status: Married    Spouse name: Not on file   Number of children: Not on file    Years of education: Not on file   Highest education level: Not on file  Occupational History   Not on file  Tobacco Use   Smoking status: Every Day    Current packs/day: 1.00    Average packs/day: 1 pack/day for 30.0 years (30.0 ttl pk-yrs)    Types: Cigarettes   Smokeless tobacco: Never  Vaping Use   Vaping status: Never Used  Substance and Sexual Activity   Alcohol use: No   Drug use: No   Sexual activity: Yes    Comment: married  Other Topics Concern   Not on file  Social History Narrative   Not on file   Social Drivers of Health   Financial Resource Strain: Not on file  Food Insecurity: No Food Insecurity (03/17/2021)   Received from [facility]   Hunger Vital Sign    Worried About Running Out of Food in the Last Year: Never true    Ran Out of Food in the Last Year: Never true  Transportation Needs: Not on file  Physical Activity: Not on file  Stress: No Stress Concern Present (10/30/2020)   Received from [facility], [facility]   [employer] of Occupational Health - Occupational Stress Questionnaire    Feeling of Stress : Not at all  Social Connections: Unknown (06/03/2021)   Received from [facility], [facility]   Social Network    Social Network: Not on file  Intimate Partner Violence: Unknown (04/27/2021)   Received from [facility], [facility]   HITS    Physically Hurt: Not on file    Insult or Talk Down To: Not on file    Threaten Physical Harm: Not on file    Scream or Curse: Not on file    Outpatient Encounter Medications as of 03/29/2023  Medication Sig   Blood Glucose Monitoring Suppl DEVI 1 each by Does not apply route in the morning, at noon, and at bedtime. May substitute to any manufacturer covered by patient's insurance.   EPINEPHrine 0.3 mg/0.3 mL IJ SOAJ injection Inject 0.3 mg into the muscle as needed for anaphylaxis.   sildenafil (REVATIO) 20 MG tablet Take 1 tablet (20 mg total) by mouth daily as needed.    [DISCONTINUED] hydrochlorothiazide (HYDRODIURIL) 25 MG tablet Take 1 tablet (25 mg total) by mouth daily.   [DISCONTINUED] lisinopril (ZESTRIL) 40 MG tablet Take 1 tablet (40 mg total) by mouth daily.   [DISCONTINUED] metFORMIN (GLUCOPHAGE-XR) 500 MG 24 hr tablet TAKE 1 TABLET BY MOUTH 2 TIMES DAILY WITH A MEAL.   [DISCONTINUED] metoprolol succinate (TOPROL-XL) 100 MG 24 hr tablet Take 1 tablet (100 mg total) by mouth daily.   [DISCONTINUED] rosuvastatin (CRESTOR) 10 MG tablet Take 1 tablet (10 mg total) by mouth daily.   hydrochlorothiazide (HYDRODIURIL) 25 MG tablet Take 1 tablet (25  mg total) by mouth daily.   lisinopril (ZESTRIL) 40 MG tablet Take 1 tablet (40 mg total) by mouth daily.   metFORMIN (GLUCOPHAGE-XR) 500 MG 24 hr tablet Take 1 tablet (500 mg total) by mouth daily with breakfast.   metoprolol succinate (TOPROL-XL) 100 MG 24 hr tablet Take 1 tablet (100 mg total) by mouth daily.   rosuvastatin (CRESTOR) 10 MG tablet Take 1 tablet (10 mg total) by mouth daily.   No facility-administered encounter medications on file as of 03/29/2023.    Allergies  Allergen Reactions   Bee Venom Hives   Loratadine Hives   Allegra [Fexofenadine Hcl]    Zyrtec [Cetirizine] Rash    Review of Systems      Objective:  BP 124/79 (BP Location: Left Arm, Cuff Size: Normal)   Pulse 62   Resp 20   Ht 6' (1.829 m)   Wt 262 lb 11.2 oz (119.2 kg)   SpO2 97%   BMI 35.63 kg/m    Wt Readings from Last 3 Encounters:  03/29/23 262 lb 11.2 oz (119.2 kg)  09/24/22 270 lb 1.9 oz (122.5 kg)  06/24/22 269 lb 4.8 oz (122.2 kg)    Physical Exam  Results for orders placed or performed in visit on 03/29/23  POCT HgB A1C   Collection Time: 03/29/23  8:43 AM  Result Value Ref Range   Hemoglobin A1C 6.4 (A) 4.0 - 5.6 %   HbA1c POC (<> result, manual entry)     HbA1c, POC (prediabetic range)     HbA1c, POC (controlled diabetic range) 6.4 0.0 - 7.0 %       Pertinent labs & imaging results that were  available during my care of the patient were reviewed by me and considered in my medical decision making.   Continue healthy lifestyle choices, including diet (rich in fruits, vegetables, and lean proteins, and low in salt and simple carbohydrates) and exercise (at least 30 minutes of moderate physical activity daily).  The above assessment and management plan was discussed with the patient. The patient verbalized understanding of and has agreed to the management plan. Patient is aware to call the clinic if they develop any new symptoms or if symptoms persist or worsen. Patient is aware when to return to the clinic for a follow-up visit. Patient educated on when it is appropriate to go to the emergency department.   Maryelizabeth Kaufmann Student AGNP

## 2023-03-30 ENCOUNTER — Encounter: Payer: Self-pay | Admitting: Medical-Surgical

## 2023-03-30 LAB — CMP14+EGFR
ALT: 27 IU/L (ref 0–44)
AST: 19 IU/L (ref 0–40)
Albumin: 4.3 g/dL (ref 3.8–4.9)
Alkaline Phosphatase: 59 IU/L (ref 44–121)
BUN/Creatinine Ratio: 19 (ref 9–20)
BUN: 23 mg/dL (ref 6–24)
Bilirubin Total: 0.2 mg/dL (ref 0.0–1.2)
CO2: 21 mmol/L (ref 20–29)
Calcium: 9.2 mg/dL (ref 8.7–10.2)
Chloride: 108 mmol/L — ABNORMAL HIGH (ref 96–106)
Creatinine, Ser: 1.22 mg/dL (ref 0.76–1.27)
Globulin, Total: 2.2 g/dL (ref 1.5–4.5)
Glucose: 102 mg/dL — ABNORMAL HIGH (ref 70–99)
Potassium: 4.7 mmol/L (ref 3.5–5.2)
Sodium: 143 mmol/L (ref 134–144)
Total Protein: 6.5 g/dL (ref 6.0–8.5)
eGFR: 69 mL/min/{1.73_m2} (ref >59)

## 2023-03-30 LAB — LIPID PANEL
Chol/HDL Ratio: 4.1 ratio (ref 0.0–5.0)
Cholesterol, Total: 127 mg/dL (ref 100–199)
HDL: 31 mg/dL — ABNORMAL LOW (ref >39)
LDL Chol Calc (NIH): 69 mg/dL (ref 0–99)
Triglycerides: 153 mg/dL — ABNORMAL HIGH (ref 0–149)
VLDL Cholesterol Cal: 27 mg/dL (ref 5–40)

## 2023-03-30 LAB — VITAMIN B12: Vitamin B-12: 411 pg/mL (ref 232–1245)

## 2023-03-30 LAB — CBC
Hematocrit: 41.4 % (ref 37.5–51.0)
Hemoglobin: 13.8 g/dL (ref 13.0–17.7)
MCH: 32.5 pg (ref 26.6–33.0)
MCHC: 33.3 g/dL (ref 31.5–35.7)
MCV: 98 fL — ABNORMAL HIGH (ref 79–97)
Platelets: 243 10*3/uL (ref 150–450)
RBC: 4.24 x10E6/uL (ref 4.14–5.80)
RDW: 12.9 % (ref 11.6–15.4)
WBC: 7.9 10*3/uL (ref 3.4–10.8)

## 2023-03-30 NOTE — Progress Notes (Signed)
 Medical screening examination/treatment was performed by qualified clinical staff member and as supervising provider I was immediately available for consultation/collaboration. I have reviewed documentation and agree with assessment and plan.  Thayer Ohm, DNP, APRN, FNP-BC [facility] and [facility] Medicine

## 2023-04-12 ENCOUNTER — Ambulatory Visit

## 2023-04-12 DIAGNOSIS — F17210 Nicotine dependence, cigarettes, uncomplicated: Secondary | ICD-10-CM

## 2023-04-12 DIAGNOSIS — Z122 Encounter for screening for malignant neoplasm of respiratory organs: Secondary | ICD-10-CM | POA: Diagnosis not present

## 2023-04-30 ENCOUNTER — Other Ambulatory Visit: Payer: Self-pay | Admitting: Medical-Surgical

## 2023-04-30 DIAGNOSIS — E119 Type 2 diabetes mellitus without complications: Secondary | ICD-10-CM

## 2023-05-07 ENCOUNTER — Encounter: Payer: Self-pay | Admitting: Medical-Surgical

## 2023-05-07 DIAGNOSIS — I2510 Atherosclerotic heart disease of native coronary artery without angina pectoris: Secondary | ICD-10-CM

## 2023-05-07 DIAGNOSIS — R933 Abnormal findings on diagnostic imaging of other parts of digestive tract: Secondary | ICD-10-CM

## 2023-05-10 NOTE — Telephone Encounter (Signed)
 Thank you, sent

## 2023-05-10 NOTE — Telephone Encounter (Signed)
 FYI on the GI referral.

## 2023-05-12 ENCOUNTER — Ambulatory Visit (INDEPENDENT_AMBULATORY_CARE_PROVIDER_SITE_OTHER): Payer: Self-pay

## 2023-05-12 DIAGNOSIS — I2510 Atherosclerotic heart disease of native coronary artery without angina pectoris: Secondary | ICD-10-CM

## 2023-05-14 ENCOUNTER — Encounter: Payer: Self-pay | Admitting: Medical-Surgical

## 2023-05-22 ENCOUNTER — Other Ambulatory Visit: Payer: Self-pay | Admitting: Medical-Surgical

## 2023-05-22 DIAGNOSIS — I10 Essential (primary) hypertension: Secondary | ICD-10-CM

## 2023-06-19 ENCOUNTER — Ambulatory Visit
Admission: EM | Admit: 2023-06-19 | Discharge: 2023-06-19 | Disposition: A | Discharge status: Home / Self Care | Attending: Emergency Medicine | Admitting: Emergency Medicine

## 2023-06-19 ENCOUNTER — Other Ambulatory Visit: Payer: Self-pay

## 2023-06-19 ENCOUNTER — Ambulatory Visit (INDEPENDENT_AMBULATORY_CARE_PROVIDER_SITE_OTHER)

## 2023-06-19 DIAGNOSIS — M79642 Pain in left hand: Secondary | ICD-10-CM

## 2023-06-19 DIAGNOSIS — W19XXXA Unspecified fall, initial encounter: Secondary | ICD-10-CM | POA: Diagnosis not present

## 2023-06-19 DIAGNOSIS — S2242XA Multiple fractures of ribs, left side, initial encounter for closed fracture: Secondary | ICD-10-CM | POA: Diagnosis not present

## 2023-06-19 DIAGNOSIS — R0781 Pleurodynia: Secondary | ICD-10-CM

## 2023-06-19 MED ORDER — HYDROCODONE-ACETAMINOPHEN 5-325 MG PO TABS: 2.0000 | ORAL_TABLET | ORAL | 0 refills | Status: DC | PRN

## 2023-06-19 NOTE — ED Notes (Signed)
 No incentive spirometer, NP Harlow Lighter made aware. Pt thinks he has one at home per NP. If not he can get one from [employer] location per [employer].

## 2023-06-19 NOTE — ED Provider Notes (Signed)
 [facility]    CSN: [id number] Arrival date & time: 06/19/23  1535      History   Chief Complaint Chief Complaint  Patient presents with   Fall    HPI Jeremy Melton is a 58 y.o. male.   Patient presents to clinic over concern of left hand pain and left-sided rib cage pain after a fall from around 3 feet yesterday.  He fell into his truck and immediately had left-sided rib cage pain.  He has not had any shortness of breath or wheezing.  Noticed this morning that his left middle finger was bruised, stiff and swollen.  Did not hit his head and did not lose consciousness.  Has not taken any medications or tried any interventions.  The history is provided by the patient and medical records.  Fall    Past Medical History:  Diagnosis Date   Bronchitis    Hypertension    Sleep apnea     Patient Active Problem List   Diagnosis Date Noted   Controlled type 2 diabetes mellitus without complication, without long-term current use of insulin (HCC) 03/29/2023   Centrilobular emphysema (HCC) 04/12/2022   Aortic atherosclerosis (HCC) 04/12/2022   Plantar fasciitis 03/19/2022   Anal fissure 10/06/2021   Grade III hemorrhoids 02/28/2019   Bleeding internal hemorrhoids 12/13/2018   Cigarette nicotine dependence, uncomplicated 06/11/2015   Insomnia 06/11/2015   Sleep apnea 09/06/2013   Arch pain of right foot 08/21/2013   Class 2 severe obesity due to excess calories with serious comorbidity and body mass index (BMI) of 35.0 to 35.9 in adult [facility]) 04/18/2013   HTN (hypertension) 04/18/2013    Past Surgical History:  Procedure Laterality Date   CHOLECYSTECTOMY     NASAL SINUS SURGERY         Home Medications    Prior to Admission medications   Medication Sig Start Date End Date Taking? Authorizing Provider  HYDROcodone -acetaminophen  (NORCO/VICODIN) 5-325 MG tablet Take 2 tablets by mouth every 4 (four) hours as needed. 06/19/23  Yes Daijha Leggio  N, FNP   Blood Glucose Monitoring Suppl DEVI 1 each by Does not apply route in the morning, at noon, and at bedtime. May substitute to any manufacturer covered by patient's insurance. 03/25/22   Cherre Cornish, NP  EPINEPHrine  0.3 mg/0.3 mL IJ SOAJ injection Inject 0.3 mg into the muscle as needed for anaphylaxis. 03/19/22   Cherre Cornish, NP  hydrochlorothiazide  (HYDRODIURIL ) 25 MG tablet Take 1 tablet (25 mg total) by mouth daily. 03/29/23   Cherre Cornish, NP  lisinopril  (ZESTRIL ) 40 MG tablet Take 1 tablet (40 mg total) by mouth daily. 03/29/23   Cherre Cornish, NP  metFORMIN  (GLUCOPHAGE -XR) 500 MG 24 hr tablet Take 1 tablet (500 mg total) by mouth daily with breakfast. 03/29/23   Cherre Cornish, NP  metoprolol  succinate (TOPROL -XL) 100 MG 24 hr tablet Take 1 tablet (100 mg total) by mouth daily. 03/29/23   Cherre Cornish, NP  rosuvastatin  (CRESTOR ) 10 MG tablet Take 1 tablet (10 mg total) by mouth daily. 03/29/23   Cherre Cornish, NP  sildenafil  (REVATIO ) 20 MG tablet Take 1 tablet (20 mg total) by mouth daily as needed. 03/19/22   Cherre Cornish, NP    Family History Family History  Problem Relation Age of Onset   Hypertension Mother    Hypertension Father    Heart failure Father    Cancer Father        kidney   Hypertension Brother     Social  History Social History   Tobacco Use   Smoking status: Every Day    Current packs/day: 1.00    Average packs/day: 1 pack/day for 30.0 years (30.0 ttl pk-yrs)    Types: Cigarettes   Smokeless tobacco: Never  Vaping Use   Vaping status: Never Used  Substance Use Topics   Alcohol use: No   Drug use: No     Allergies   Bee venom, Loratadine, Allegra [fexofenadine hcl], and Zyrtec [cetirizine]   Review of Systems Review of Systems  Per HPI  Physical Exam Triage Vital Signs ED Triage Vitals  Encounter Vitals Group     BP 06/19/23 1540 (!) 142/84     Systolic BP Percentile --      Diastolic BP Percentile --      Pulse Rate 06/19/23 1540 74     Resp 06/19/23 1540 18      Temp 06/19/23 1540 98.7 F (37.1 C)     Temp src --      SpO2 06/19/23 1540 97 %     Weight --      Height --      Head Circumference --      Peak Flow --      Pain Score 06/19/23 1541 2     Pain Loc --      Pain Education --      Exclude from Growth Chart --    No data found.  Updated Vital Signs BP (!) 142/84   Pulse 74   Temp 98.7 F (37.1 C)   Resp 18   SpO2 97%   Visual Acuity Right Eye Distance:   Left Eye Distance:   Bilateral Distance:    Right Eye Near:   Left Eye Near:    Bilateral Near:     Physical Exam Vitals and nursing note reviewed.  Constitutional:      Appearance: Normal appearance.  HENT:     Head: Normocephalic and atraumatic.     Right Ear: External ear normal.     Left Ear: External ear normal.     Nose: Nose normal.     Mouth/Throat:     Mouth: Mucous membranes are moist.  Eyes:     Conjunctiva/sclera: Conjunctivae normal.  Cardiovascular:     Rate and Rhythm: Normal rate and regular rhythm.     Heart sounds: Normal heart sounds. No murmur heard. Pulmonary:     Effort: Pulmonary effort is normal. No respiratory distress.     Breath sounds: Normal breath sounds.  Chest:    Musculoskeletal:        General: Swelling, tenderness and signs of injury present. Normal range of motion.     Left hand: Swelling present. Normal capillary refill. Normal pulse.     Comments: Left hand with swelling and bruising along the middle finger.  Brisk capillary refill and full range of motion.  Skin:    General: Skin is warm and dry.  Neurological:     General: No focal deficit present.     Mental Status: He is alert.  Psychiatric:        Mood and Affect: Mood normal.      UC Treatments / Results  Labs (all labs ordered are listed, but only abnormal results are displayed) Labs Reviewed - No data to display  EKG   Radiology DG Hand Complete Left Result Date: 06/19/2023 CLINICAL DATA:  Marvell Slider 3 feet, left hand pain EXAM: LEFT HAND -  COMPLETE 3+ VIEW COMPARISON:  None Available. FINDINGS: Frontal, oblique, and lateral views of the left hand are obtained. No fracture, subluxation, or dislocation. Joint spaces are well preserved. Soft tissues are unremarkable. IMPRESSION: 1. Unremarkable left hand. Electronically Signed   By: Bobbye Burrow M.D.   On: 06/19/2023 16:15   DG Ribs Unilateral W/Chest Left Result Date: 06/19/2023 CLINICAL DATA:  Marvell Slider 3 feet, left anterior rib pain EXAM: LEFT RIBS AND CHEST - 3+ VIEW COMPARISON:  05/29/2022 FINDINGS: Frontal view of the chest as well as frontal and oblique views of the left thoracic cage are obtained. The cardiac silhouette is unremarkable. No acute airspace disease, effusion, or pneumothorax. There are minimally displaced left anterior seventh and eighth rib fractures. No other acute bony abnormalities. IMPRESSION: 1. Minimally displaced left anterior seventh and eighth rib fractures. 2. No effusion or pneumothorax. Electronically Signed   By: Bobbye Burrow M.D.   On: 06/19/2023 16:14    Procedures Procedures (including critical care time)  Medications Ordered in UC Medications - No data to display  Initial Impression / Assessment and Plan / UC Course  I have reviewed the triage vital signs and the nursing notes.  Pertinent labs & imaging results that were available during my care of the patient were reviewed by me and considered in my medical decision making (see chart for details).  Vitals in triage reviewed, patient is hemodynamically stable.  Lungs are vesicular, heart with regular rate and rhythm.  Left hand middle finger with bruising and swelling, full range of motion, brisk capillary refill.  Radial pulses 2+.  Imaging by my interpretation does not show any acute fracture or deformity, confirmed with radiology overread.  Advised buddy taping and elevation.  Able to speak in full sentences, oxygenation stable on room air.  Left rib imaging shows nondisplaced 8th and 9th rib  cage fractures by my interpretation, confirmed with radiology overread.  No signs of pneumothorax.  Advise incentive spirometer, splinting and pain management.  Plan of care, follow-up care return precautions given, no questions at this time.     Final Clinical Impressions(s) / UC Diagnoses   Final diagnoses:  Fall, initial encounter  Closed fracture of multiple ribs of left side, initial encounter  Left hand pain     Discharge Instructions      Radiology saw 2 rib fractures on your left side.  You can take the Norco every 6 hours as needed for severe pain.  This is a narcotics that may cause sedation, drowsiness or constipation.  Please splint the area when you are coughing or sneezing.  Use the incentive spirometer every hour to help prevent pneumonia.  Buddy taping the fingers can help provide support.  Radiology did not see any breaks in your hand x-ray.  Rib fractures do take a while to heal.  Follow-up with your primary care provider if no improvement in pain over the next few weeks.  ED Prescriptions     Medication Sig Dispense Auth. Provider   HYDROcodone -acetaminophen  (NORCO/VICODIN) 5-325 MG tablet Take 2 tablets by mouth every 4 (four) hours as needed. 15 tablet Harlow Lighter, Nivedita Mirabella  N, FNP      I have reviewed the PDMP during this encounter.   Harlow Lighter, Kongmeng Santoro  N, FNP 06/19/23 1622

## 2023-06-19 NOTE — ED Triage Notes (Addendum)
 While trying to stand on tailgait, fell off about 3 ft. Has c/o left rib pain. This occurred yesterday. Has c/o left hand pain as well.

## 2023-06-19 NOTE — Discharge Instructions (Addendum)
 Radiology saw 2 rib fractures on your left side.  You can take the Norco every 6 hours as needed for severe pain.  This is a narcotics that may cause sedation, drowsiness or constipation.  Please splint the area when you are coughing or sneezing.  Use the incentive spirometer every hour to help prevent pneumonia.  Buddy taping the fingers can help provide support.  Radiology did not see any breaks in your hand x-ray.  Rib fractures do take a while to heal.  Follow-up with your primary care provider if no improvement in pain over the next few weeks.

## 2023-07-31 ENCOUNTER — Ambulatory Visit
Admission: EM | Admit: 2023-07-31 | Discharge: 2023-07-31 | Disposition: A | Discharge status: Home / Self Care | Attending: Family Medicine | Admitting: Family Medicine

## 2023-07-31 ENCOUNTER — Other Ambulatory Visit: Payer: Self-pay

## 2023-07-31 DIAGNOSIS — J209 Acute bronchitis, unspecified: Secondary | ICD-10-CM | POA: Diagnosis not present

## 2023-07-31 DIAGNOSIS — J019 Acute sinusitis, unspecified: Secondary | ICD-10-CM

## 2023-07-31 DIAGNOSIS — F17200 Nicotine dependence, unspecified, uncomplicated: Secondary | ICD-10-CM

## 2023-07-31 MED ORDER — DOXYCYCLINE HYCLATE 100 MG PO CAPS: 100.0000 mg | ORAL_CAPSULE | Freq: Two times a day (BID) | ORAL | 0 refills | Status: DC

## 2023-07-31 MED ORDER — HYDROCOD POLI-CHLORPHE POLI ER 10-8 MG/5ML PO SUER: 5.0000 mL | Freq: Two times a day (BID) | ORAL | 0 refills | Status: DC | PRN

## 2023-07-31 MED ORDER — PREDNISONE 50 MG PO TABS
ORAL_TABLET | ORAL | 0 refills | Status: DC
Start: 2023-07-31 — End: 2023-09-29

## 2023-07-31 NOTE — ED Provider Notes (Signed)
 [facility]    CSN: [id number] Arrival date & time: 07/31/23  1152      History   Chief Complaint Chief Complaint  Patient presents with   Cough    HPI Collen Melton is a 58 y.o. male.   HPI  Pleasant 58 year old gentleman.  Smoker.  Chronic recurring bronchitis.  Central emphysema.  Is here for cough.  It has been going on for 2 weeks.  He has sinus congestion and postnasal drip.  Cough and chest congestion.  Feels slightly short of breath.  Coughing up sputum.  Feels like he has bronchitis. No fever or chills.  No fatigue.  No history of pneumonia.  Past Medical History:  Diagnosis Date   Bronchitis    Hypertension    Sleep apnea     Patient Active Problem List   Diagnosis Date Noted   Controlled type 2 diabetes mellitus without complication, without long-term current use of insulin (HCC) 03/29/2023   Centrilobular emphysema (HCC) 04/12/2022   Aortic atherosclerosis (HCC) 04/12/2022   Plantar fasciitis 03/19/2022   Anal fissure 10/06/2021   Grade III hemorrhoids 02/28/2019   Bleeding internal hemorrhoids 12/13/2018   Cigarette nicotine dependence, uncomplicated 06/11/2015   Insomnia 06/11/2015   Sleep apnea 09/06/2013   Arch pain of right foot 08/21/2013   Class 2 severe obesity due to excess calories with serious comorbidity and body mass index (BMI) of 35.0 to 35.9 in adult [facility]) 04/18/2013   HTN (hypertension) 04/18/2013    Past Surgical History:  Procedure Laterality Date   CHOLECYSTECTOMY     NASAL SINUS SURGERY         Home Medications    Prior to Admission medications   Medication Sig Start Date End Date Taking? Authorizing Provider  chlorpheniramine-HYDROcodone  (TUSSIONEX) 10-8 MG/5ML Take 5 mLs by mouth every 12 (twelve) hours as needed for cough. 07/31/23  Yes Maranda Jamee Jacob, MD  doxycycline  (VIBRAMYCIN ) 100 MG capsule Take 1 capsule (100 mg total) by mouth 2 (two) times daily. 07/31/23  Yes Maranda Jamee Jacob, MD  predniSONE   (DELTASONE ) 50 MG tablet Take once a day for 5 days.  Take with food 07/31/23  Yes Maranda Jamee Jacob, MD  Blood Glucose Monitoring Suppl DEVI 1 each by Does not apply route in the morning, at noon, and at bedtime. May substitute to any manufacturer covered by patient's insurance. 03/25/22   Willo Mini, NP  EPINEPHrine  0.3 mg/0.3 mL IJ SOAJ injection Inject 0.3 mg into the muscle as needed for anaphylaxis. 03/19/22   Willo Mini, NP  hydrochlorothiazide  (HYDRODIURIL ) 25 MG tablet Take 1 tablet (25 mg total) by mouth daily. 03/29/23   Willo Mini, NP  HYDROcodone -acetaminophen  (NORCO/VICODIN) 5-325 MG tablet Take 2 tablets by mouth every 4 (four) hours as needed. 06/19/23   Dreama, [place]  N, FNP  lisinopril  (ZESTRIL ) 40 MG tablet Take 1 tablet (40 mg total) by mouth daily. 03/29/23   Willo Mini, NP  metFORMIN  (GLUCOPHAGE -XR) 500 MG 24 hr tablet Take 1 tablet (500 mg total) by mouth daily with breakfast. 03/29/23   Willo Mini, NP  metoprolol  succinate (TOPROL -XL) 100 MG 24 hr tablet Take 1 tablet (100 mg total) by mouth daily. 03/29/23   Willo Mini, NP  rosuvastatin  (CRESTOR ) 10 MG tablet Take 1 tablet (10 mg total) by mouth daily. 03/29/23   Willo Mini, NP  sildenafil  (REVATIO ) 20 MG tablet Take 1 tablet (20 mg total) by mouth daily as needed. 03/19/22   Willo Mini, NP    Family  History Family History  Problem Relation Age of Onset   Hypertension Mother    Hypertension Father    Heart failure Father    Cancer Father        kidney   Hypertension Brother     Social History Social History   Tobacco Use   Smoking status: Every Day    Current packs/day: 1.00    Average packs/day: 1 pack/day for 30.0 years (30.0 ttl pk-yrs)    Types: Cigarettes   Smokeless tobacco: Never  Vaping Use   Vaping status: Never Used  Substance Use Topics   Alcohol use: No   Drug use: No     Allergies   Bee venom, Loratadine, Allegra [fexofenadine hcl], and Zyrtec [cetirizine]   Review of Systems Review of  Systems See HPI  Physical Exam Triage Vital Signs ED Triage Vitals  Encounter Vitals Group     BP 07/31/23 1202 113/66     Girls Systolic BP Percentile --      Girls Diastolic BP Percentile --      Boys Systolic BP Percentile --      Boys Diastolic BP Percentile --      Pulse Rate 07/31/23 1202 66     Resp 07/31/23 1202 16     Temp 07/31/23 1202 98.3 F (36.8 C)     Temp src --      SpO2 07/31/23 1202 96 %     Weight --      Height --      Head Circumference --      Peak Flow --      Pain Score 07/31/23 1206 3     Pain Loc --      Pain Education --      Exclude from Growth Chart --    No data found.  Updated Vital Signs BP 113/66   Pulse 66   Temp 98.3 F (36.8 C)   Resp 16   SpO2 96%      Physical Exam Constitutional:      General: He is not in acute distress.    Appearance: He is well-developed. He is ill-appearing.  HENT:     Head: Normocephalic and atraumatic.     Right Ear: Tympanic membrane normal.     Left Ear: Tympanic membrane normal.     Nose: Nose normal. No congestion.     Mouth/Throat:     Mouth: Mucous membranes are moist.     Pharynx: No posterior oropharyngeal erythema.  Eyes:     Conjunctiva/sclera: Conjunctivae normal.     Pupils: Pupils are equal, round, and reactive to light.  Cardiovascular:     Rate and Rhythm: Normal rate and regular rhythm.     Heart sounds: Normal heart sounds.  Pulmonary:     Effort: Pulmonary effort is normal. No respiratory distress.     Breath sounds: Wheezing and rhonchi present.     Comments: Scattered anterior rhonchi and wheeze Abdominal:     General: There is no distension.     Palpations: Abdomen is soft.  Musculoskeletal:        General: Normal range of motion.     Cervical back: Normal range of motion.  Lymphadenopathy:     Cervical: No cervical adenopathy.  Skin:    General: Skin is warm and dry.  Neurological:     Mental Status: He is alert.      UC Treatments / Results  Labs (all  labs ordered are listed, but  only abnormal results are displayed) Labs Reviewed - No data to display  EKG   Radiology No results found.  Procedures Procedures (including critical care time)  Medications Ordered in UC Medications - No data to display  Initial Impression / Assessment and Plan / UC Course  I have reviewed the triage vital signs and the nursing notes.  Pertinent labs & imaging results that were available during my care of the patient were reviewed by me and considered in my medical decision making (see chart for details).     Final Clinical Impressions(s) / UC Diagnoses   Final diagnoses:  Acute bronchitis, unspecified organism  Tobacco dependence  Acute sinusitis with symptoms greater than 10 days     Discharge Instructions      Drink lots of fluids Take doxycycline  2 times a day for a week Take the antibiotic with food Take prednisone  once a day for 5 days May take over-the-counter cough medicines during the day Take Tussionex as needed at night.  May take twice a day.  Tussionex can cause drowsiness See your doctor in follow-up    ED Prescriptions     Medication Sig Dispense Auth. Provider   doxycycline  (VIBRAMYCIN ) 100 MG capsule Take 1 capsule (100 mg total) by mouth 2 (two) times daily. 14 capsule Maranda Jamee Jacob, MD   predniSONE  (DELTASONE ) 50 MG tablet Take once a day for 5 days.  Take with food 5 tablet Maranda Jamee Jacob, MD   chlorpheniramine-HYDROcodone  (TUSSIONEX) 10-8 MG/5ML Take 5 mLs by mouth every 12 (twelve) hours as needed for cough. 115 mL Maranda Jamee Jacob, MD      I have reviewed the PDMP during this encounter.   Maranda Jamee Jacob, MD 07/31/23 1310

## 2023-07-31 NOTE — Discharge Instructions (Signed)
 Drink lots of fluids Take doxycycline  2 times a day for a week Take the antibiotic with food Take prednisone  once a day for 5 days May take over-the-counter cough medicines during the day Take Tussionex as needed at night.  May take twice a day.  Tussionex can cause drowsiness See your doctor in follow-up

## 2023-07-31 NOTE — ED Triage Notes (Signed)
 Sinus congestion x 2 weeks. Last couple of mornings has had cough, can feel it in chest. Hx bronchitis. Scheduled for endoscopy Wednesday. No otc meds.

## 2023-09-29 ENCOUNTER — Ambulatory Visit: Admitting: Medical-Surgical

## 2023-09-29 ENCOUNTER — Encounter: Payer: Self-pay | Admitting: Medical-Surgical

## 2023-09-29 VITALS — BP 118/76 | HR 56 | Resp 20 | Ht 72.0 in | Wt 262.0 lb

## 2023-09-29 DIAGNOSIS — M79671 Pain in right foot: Secondary | ICD-10-CM

## 2023-09-29 DIAGNOSIS — E119 Type 2 diabetes mellitus without complications: Secondary | ICD-10-CM

## 2023-09-29 DIAGNOSIS — Z23 Encounter for immunization: Secondary | ICD-10-CM | POA: Diagnosis not present

## 2023-09-29 DIAGNOSIS — I700 Atherosclerosis of aorta: Secondary | ICD-10-CM | POA: Diagnosis not present

## 2023-09-29 DIAGNOSIS — E1169 Type 2 diabetes mellitus with other specified complication: Secondary | ICD-10-CM | POA: Diagnosis not present

## 2023-09-29 DIAGNOSIS — E785 Hyperlipidemia, unspecified: Secondary | ICD-10-CM

## 2023-09-29 DIAGNOSIS — D130 Benign neoplasm of esophagus: Secondary | ICD-10-CM | POA: Insufficient documentation

## 2023-09-29 DIAGNOSIS — Z7984 Long term (current) use of oral hypoglycemic drugs: Secondary | ICD-10-CM

## 2023-09-29 DIAGNOSIS — M79672 Pain in left foot: Secondary | ICD-10-CM

## 2023-09-29 DIAGNOSIS — I10 Essential (primary) hypertension: Secondary | ICD-10-CM

## 2023-09-29 DIAGNOSIS — F17210 Nicotine dependence, cigarettes, uncomplicated: Secondary | ICD-10-CM

## 2023-09-29 LAB — POCT GLYCOSYLATED HEMOGLOBIN (HGB A1C)
HbA1c, POC (controlled diabetic range): 6.8 % (ref 0.0–7.0)
Hemoglobin A1C: 6.8 % — AB (ref 4.0–5.6)

## 2023-09-29 LAB — POCT UA - MICROALBUMIN
Albumin/Creatinine Ratio, Urine, POC: <300
Creatinine, POC: 300 mg/dL
Microalbumin Ur, POC: 30 mg/L

## 2023-09-29 NOTE — Progress Notes (Signed)
 Established patient visit   History of Present Illness   Discussed the use of AI scribe software for clinical note transcription with the patient, who gave verbal consent to proceed.  History of Present Illness   Jeremy Melton is a 58 year old male with hypertension, hyperlipidemia, and diabetes who presents for follow-up.  Hypertension - Managed with hydrochlorothiazide , lisinopril , and metoprolol  - Does not monitor BP at home regularly - Generally, avoids extra salt, but wife adds salt to food  Diabetes mellitus type 2 - Managed with metformin  and home blood sugar monitoring - A1c was 6.4 in March  - Recent higher blood sugar levels attributed to dietary changes - Has started drinking soda again  Hyperlipidemia/Aortic atherosclerosis - No specific symptoms or recent laboratory values provided - Taking Crestor  10mg  daily, well tolerated  Bilateral foot pain - Foot pain attributed to plantar fasciitis - Custom orthotics previously provided relief - Currently uses a product from [employer], which is less effective than custom orthotics  Oropharyngeal nodule - Recent endoscopy revealed a 2 cm nodule in the throat - Nodule causes discomfort  Tobacco use - Continues to smoke - Not considering quitting at this time - Acknowledges health risks associated with smoking      Physical Exam   Physical Exam Vitals and nursing note reviewed.  Constitutional:      General: He is not in acute distress.    Appearance: Normal appearance. He is obese. He is not ill-appearing.  HENT:     Head: Normocephalic and atraumatic.  Cardiovascular:     Rate and Rhythm: Normal rate and regular rhythm.     Pulses: Normal pulses.     Heart sounds: Normal heart sounds. No murmur heard.    No friction rub. No gallop.  Pulmonary:     Effort: Pulmonary effort is normal. No respiratory distress.     Breath sounds: Normal breath sounds.  Skin:    General: Skin is warm and dry.   Neurological:     Mental Status: He is alert and oriented to person, place, and time.  Psychiatric:        Mood and Affect: Mood normal.        Behavior: Behavior normal.        Thought Content: Thought content normal.        Judgment: Judgment normal.    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 - Follow up with GI as scheduled.  Type 2 diabetes mellitus, controlled Type 2 diabetes mellitus remains controlled with A1c of 6.8. Reports higher blood sugar readings likely due to dietary changes. - Continue metformin  once daily. - Encourage reduction of soda and carbohydrate intake.  Essential hypertension, controlled Hypertension well-controlled with current medication regimen. - Continue current antihypertensive medications: hydrochlorothiazide , lisinopril , and metoprolol . - Encourage monitoring of blood pressure at home.  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 Current orthotics old and inadequate. - Refer to specialist for custom orthotics.  Tobacco use Continues to smoke, not considering quitting. Discussed health risks and cessation aids. - Offer support and resources for smoking cessation if he decides to quit in the future.  General Health Maintenance Due for pneumonia and tetanus vaccinations. Declined pneumonia vaccine, agreed to tetanus update. No recent eye examination. - Administer tetanus vaccine. - Encourage scheduling an eye  examination.        Follow up   Return in about 6 months (around 03/28/2024) for DM/HTN/HLD follow up. __________________________________ Zada FREDRIK Palin, DNP, APRN, FNP-BC Primary Care and Sports Medicine [facility] [place]

## 2023-10-06 ENCOUNTER — Ambulatory Visit

## 2023-10-06 VITALS — BP 139/78 | Ht 73.0 in | Wt 265.0 lb

## 2023-10-06 DIAGNOSIS — M79671 Pain in right foot: Secondary | ICD-10-CM

## 2023-10-06 DIAGNOSIS — Q6671 Congenital pes cavus, right foot: Secondary | ICD-10-CM

## 2023-10-06 DIAGNOSIS — M722 Plantar fascial fibromatosis: Secondary | ICD-10-CM

## 2023-10-06 DIAGNOSIS — M79672 Pain in left foot: Secondary | ICD-10-CM

## 2023-10-06 DIAGNOSIS — Q6672 Congenital pes cavus, left foot: Secondary | ICD-10-CM | POA: Diagnosis not present

## 2023-10-07 NOTE — Progress Notes (Addendum)
 PCP: Willo Mini, NP  Subjective:   HPI: Patient is a 58 y.o. male with a history of chronic plantar fasciitis who presents for new orthotic fitting.  Patient had previously seen Dr. ONEIDA many years ago and had been fit for custom orthotics.  Said after custom orthotic fitting his feet felt great and he had multiple years with no pain.  However, orthotics wore out a couple years ago and since then he has been going to [facility] and wearing OTC arch support with OTC orthotics.  Says this helped somewhat but his pain over bilateral heels has returned.  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  Denies any acute injuries over the last months but states pain has progressively gotten worse.  Here today for fitting of custom orthotics.  Past Medical History:  Diagnosis Date   Bronchitis    Hypertension    Sleep apnea     Current Outpatient Medications on File Prior to Visit  Medication Sig Dispense Refill   Blood Glucose Monitoring Suppl DEVI 1 each by Does not apply route in the morning, at noon, and at bedtime. May substitute to any manufacturer covered by patient's insurance. 1 each 0   EPINEPHrine  0.3 mg/0.3 mL IJ SOAJ injection Inject 0.3 mg into the muscle as needed for anaphylaxis. 1 each 11   HYDROcodone -acetaminophen  (NORCO/VICODIN) 5-325 MG tablet Take 2 tablets by mouth every 4 (four) hours as needed. 15 tablet 0   lisinopril  (ZESTRIL ) 40 MG tablet Take 1 tablet (40 mg total) by mouth daily. 90 tablet 3   metFORMIN  (GLUCOPHAGE -XR) 500 MG 24 hr tablet Take 1 tablet (500 mg total) by mouth daily with breakfast. 90 tablet 1   metoprolol  succinate (TOPROL -XL) 100 MG 24 hr tablet Take 1 tablet (100 mg total) by mouth daily. 90 tablet 3   rosuvastatin  (CRESTOR ) 10 MG tablet Take 1 tablet (10 mg total) by mouth daily. 90 tablet 3   sildenafil  (REVATIO ) 20 MG tablet Take 1 tablet (20 mg total) by mouth daily as needed. 10 tablet 11   No current  facility-administered medications on file prior to visit.    Past Surgical History:  Procedure Laterality Date   CHOLECYSTECTOMY     NASAL SINUS SURGERY      Allergies  Allergen Reactions   Bee Venom Hives   Loratadine Hives   Allegra [Fexofenadine Hcl]    Zyrtec [Cetirizine] Rash    BP 139/78   Ht 6' 1 (1.854 m)   Wt 265 lb (120.2 kg)   BMI 34.96 kg/m       No data to display              No data to display              Objective:  Physical Exam:  Gen: NAD, comfortable in exam room  Bilateral foot exam: On inspection no evidence of erythema, ecchymosis, or generalized edema.  No evidence of acute trauma.  Patient has tenderness to palpation over medial calcaneus where insertion of plantar fascia occurs.  Patient has normal range of motion with full plantar/dorsiflexion of feet.  On assessment of patient's foot mechanics he has pes cavus bilaterally.  On ambulation assessment he has too many toes sign and walks with external rotation.  Well-maintained arch throughout gait analysis.  No significant overpronation/supination.   Assessment & Plan:   #Chronic bilateral foot pain #Bilateral pes cavus #Chronic plantar fasciitis Patient  was fitted for a : Fit 'n Run semi-rigid orthotic  The orthotic was heated, placed on the orthotic stand. The patient was positioned in subtalar neutral position and 10 degrees of ankle dorsiflexion in a weight bearing stance on the heated orthotic blank After completion of molding Blank: Fit 'n Run - size 11 Posting:  none required Base:  none Was able to adequately support longitudinal arch using standard fit and run semirigid orthotic.  No need for scaphoid pad for additional arch support at this time.  Patient endorsed increased comfort with ambulation and standing after custom orthotic fitting.  Can return for additional modifications including adding small scaphoid pad for additional arch support if needed.  Will defer at this  time. The patient agrees to treatment plan has no further questions at this time.   states he would likely return for additional orthotic fitting that he can wear in his tennis shoes.

## 2023-12-20 ENCOUNTER — Other Ambulatory Visit: Payer: Self-pay | Admitting: Medical-Surgical

## 2023-12-20 DIAGNOSIS — E119 Type 2 diabetes mellitus without complications: Secondary | ICD-10-CM

## 2024-02-14 ENCOUNTER — Telehealth: Admitting: Physician Assistant

## 2024-02-14 DIAGNOSIS — J208 Acute bronchitis due to other specified organisms: Secondary | ICD-10-CM

## 2024-02-15 MED ORDER — PREDNISONE 20 MG PO TABS: 40.0000 mg | ORAL_TABLET | Freq: Every day | ORAL | 0 refills | Status: DC

## 2024-02-15 MED ORDER — BENZONATATE 100 MG PO CAPS: 100.0000 mg | ORAL_CAPSULE | Freq: Three times a day (TID) | ORAL | 0 refills | Status: DC | PRN

## 2024-02-15 NOTE — Progress Notes (Signed)
 We are sorry that you are not feeling well.  Here is how we plan to help!  Based on your presentation I believe you most likely have A cough due to a virus.  This is called viral bronchitis and is best treated by rest, plenty of fluids and control of the cough.  You may use Ibuprofen  or Tylenol  as directed to help your symptoms.     In addition you may use A prescription cough medication called Tessalon  Perles 100mg . You may take 1-2 capsules every 8 hours as needed for your cough. I have also sent in a course of prednisone  to take as directed.  From your responses in the eVisit questionnaire you describe inflammation in the upper respiratory tract which is causing a significant cough.  This is commonly called Bronchitis and has four common causes:   Allergies Viral Infections Acid Reflux Bacterial Infection Allergies, viruses and acid reflux are treated by controlling symptoms or eliminating the cause. An example might be a cough caused by taking certain blood pressure medications. You stop the cough by changing the medication. Another example might be a cough caused by acid reflux. Controlling the reflux helps control the cough.  USE OF BRONCHODILATOR (RESCUE) INHALERS: There is a risk from using your bronchodilator too frequently.  The risk is that over-reliance on a medication which only relaxes the muscles surrounding the breathing tubes can reduce the effectiveness of medications prescribed to reduce swelling and congestion of the tubes themselves.  Although you feel brief relief from the bronchodilator inhaler, your asthma may actually be worsening with the tubes becoming more swollen and filled with mucus.  This can delay other crucial treatments, such as oral steroid medications. If you need to use a bronchodilator inhaler daily, several times per day, you should discuss this with your provider.  There are probably better treatments that could be used to keep your asthma under control.      HOME CARE Only take medications as instructed by your medical team. Complete the entire course of an antibiotic. Drink plenty of fluids and get plenty of rest. Avoid close contacts especially the very young and the elderly Cover your mouth if you cough or cough into your sleeve. Always remember to wash your hands A steam or ultrasonic humidifier can help congestion.   GET HELP RIGHT AWAY IF: You develop worsening fever. You become short of breath You cough up blood. Your symptoms persist after you have completed your treatment plan MAKE SURE YOU  Understand these instructions. Will watch your condition. Will get help right away if you are not doing well or get worse.  Your e-visit answers were reviewed by a board certified advanced clinical practitioner to complete your personal care plan.  Depending on the condition, your plan could have included both over the counter or prescription medications. If there is a problem please reply  once you have received a response from your provider. Your safety is important to us .  If you have drug allergies check your prescription carefully.    You can use MyChart to ask questions about todays visit, request a non-urgent call back, or ask for a work or school excuse for 24 hours related to this e-Visit. If it has been greater than 24 hours you will need to follow up with your provider, or enter a new e-Visit to address those concerns. You will get an e-mail in the next two days asking about your experience.  I hope that your e-visit has been  valuable and will speed your recovery. Thank you for using e-visits.   I have spent 5 minutes in review of e-visit questionnaire, review and updating patient chart, medical decision making and response to patient.   Elsie Velma Lunger, PA-C

## 2024-02-20 ENCOUNTER — Encounter: Payer: Self-pay | Admitting: Emergency Medicine

## 2024-02-20 ENCOUNTER — Telehealth: Admitting: Nurse Practitioner

## 2024-02-20 ENCOUNTER — Ambulatory Visit
Admission: EM | Admit: 2024-02-20 | Discharge: 2024-02-20 | Disposition: A | Discharge status: Home / Self Care | Attending: Internal Medicine | Admitting: Internal Medicine

## 2024-02-20 ENCOUNTER — Ambulatory Visit

## 2024-02-20 DIAGNOSIS — R051 Acute cough: Secondary | ICD-10-CM

## 2024-02-20 DIAGNOSIS — J209 Acute bronchitis, unspecified: Secondary | ICD-10-CM

## 2024-02-20 DIAGNOSIS — R058 Other specified cough: Secondary | ICD-10-CM

## 2024-02-20 MED ORDER — HYDROCOD POLI-CHLORPHE POLI ER 10-8 MG/5ML PO SUER: 5.0000 mL | Freq: Two times a day (BID) | ORAL | 0 refills | Status: AC | PRN

## 2024-02-20 MED ORDER — DOXYCYCLINE HYCLATE 100 MG PO CAPS: 100.0000 mg | ORAL_CAPSULE | Freq: Two times a day (BID) | ORAL | 0 refills | Status: AC

## 2024-02-20 NOTE — ED Provider Notes (Signed)
 " [facility]    CSN: [id number] Arrival date & time: 02/20/24  1355      History   Chief Complaint Chief Complaint  Patient presents with   URI    HPI Jeremy Melton is a 59 y.o. male.   59 year old male who presents urgent care with complaints of cough and sinus drainage for 2 weeks.  About a week ago he did an e-visit and was given prednisone  and Tessalon .  He reports his symptoms have not improved at all and he just finished the prednisone .  He reports the cough is very severe at night and he can take the Tessalon  before bed but within 2 hours he is up coughing.  He is having some wheezing.  He denies any severe shortness of breath, fever, sore throat, nausea, vomiting.  He is having a lot of sinus congestion and sinus drainage as well.     Past Medical History:  Diagnosis Date   Bronchitis    Hypertension    Sleep apnea     Patient Active Problem List   Diagnosis Date Noted   Hyperlipidemia associated with type 2 diabetes mellitus (HCC) 09/29/2023   Leiomyoma of esophagus 09/29/2023   Controlled type 2 diabetes mellitus without complication, without long-term current use of insulin (HCC) 03/29/2023   Centrilobular emphysema (HCC) 04/12/2022   Aortic atherosclerosis 04/12/2022   Plantar fasciitis 03/19/2022   Anal fissure 10/06/2021   Grade III hemorrhoids 02/28/2019   Bleeding internal hemorrhoids 12/13/2018   Cigarette nicotine dependence, uncomplicated 06/11/2015   Insomnia 06/11/2015   Sleep apnea 09/06/2013   Bilateral foot pain 08/21/2013   Class 2 severe obesity due to excess calories with serious comorbidity and body mass index (BMI) of 35.0 to 35.9 in adult 04/18/2013   HTN (hypertension) 04/18/2013    Past Surgical History:  Procedure Laterality Date   CHOLECYSTECTOMY     NASAL SINUS SURGERY         Home Medications    Prior to Admission medications  Medication Sig Start Date End Date Taking? Authorizing Provider  benzonatate   (TESSALON ) 100 MG capsule Take 1 capsule (100 mg total) by mouth 3 (three) times daily as needed for cough. 02/15/24  Yes Gladis Elsie BROCKS, PA-C  Blood Glucose Monitoring Suppl DEVI 1 each by Does not apply route in the morning, at noon, and at bedtime. May substitute to any manufacturer covered by patient's insurance. 03/25/22  Yes Willo Mini, NP  EPINEPHrine  0.3 mg/0.3 mL IJ SOAJ injection Inject 0.3 mg into the muscle as needed for anaphylaxis. 03/19/22  Yes Willo Mini, NP  lisinopril  (ZESTRIL ) 40 MG tablet Take 1 tablet (40 mg total) by mouth daily. 03/29/23  Yes Willo Mini, NP  metFORMIN  (GLUCOPHAGE -XR) 500 MG 24 hr tablet TAKE 1 TABLET BY MOUTH EVERY DAY WITH BREAKFAST 12/21/23  Yes Willo Mini, NP  metoprolol  succinate (TOPROL -XL) 100 MG 24 hr tablet Take 1 tablet (100 mg total) by mouth daily. 03/29/23  Yes Willo Mini, NP  rosuvastatin  (CRESTOR ) 10 MG tablet Take 1 tablet (10 mg total) by mouth daily. 03/29/23  Yes Willo Mini, NP  sildenafil  (REVATIO ) 20 MG tablet Take 1 tablet (20 mg total) by mouth daily as needed. 03/19/22  Yes Willo Mini, NP  predniSONE  (DELTASONE ) 20 MG tablet Take 2 tablets (40 mg total) by mouth daily with breakfast. 02/15/24   Gladis Elsie BROCKS, PA-C    Family History Family History  Problem Relation Age of Onset   Hypertension Mother  Hypertension Father    Heart failure Father    Cancer Father        kidney   Hypertension Brother     Social History Social History[1]   Allergies   Bee venom, Loratadine, Allegra [fexofenadine hcl], and Zyrtec [cetirizine]   Review of Systems Review of Systems  Constitutional:  Negative for chills and fever.  HENT:  Positive for congestion, sinus pressure and sinus pain. Negative for ear pain and sore throat.   Eyes:  Negative for pain and visual disturbance.  Respiratory:  Positive for cough and wheezing. Negative for shortness of breath.   Cardiovascular:  Negative for chest pain and palpitations.   Gastrointestinal:  Negative for abdominal pain and vomiting.  Genitourinary:  Negative for dysuria and hematuria.  Musculoskeletal:  Negative for arthralgias and back pain.  Skin:  Negative for color change and rash.  Neurological:  Negative for seizures and syncope.  All other systems reviewed and are negative.    Physical Exam Triage Vital Signs ED Triage Vitals  Encounter Vitals Group     BP 02/20/24 1445 112/64     Girls Systolic BP Percentile --      Girls Diastolic BP Percentile --      Boys Systolic BP Percentile --      Boys Diastolic BP Percentile --      Pulse Rate 02/20/24 1445 76     Resp 02/20/24 1445 20     Temp 02/20/24 1445 98.3 F (36.8 C)     Temp Source 02/20/24 1445 Oral     SpO2 02/20/24 1445 95 %     Weight 02/20/24 1444 265 lb (120.2 kg)     Height 02/20/24 1444 6' 1 (1.854 m)     Head Circumference --      Peak Flow --      Pain Score 02/20/24 1444 0     Pain Loc --      Pain Education --      Exclude from Growth Chart --    No data found.  Updated Vital Signs BP 112/64 (BP Location: Right Arm)   Pulse 76   Temp 98.3 F (36.8 C) (Oral)   Resp 20   Ht 6' 1 (1.854 m)   Wt 265 lb (120.2 kg)   SpO2 95%   BMI 34.96 kg/m   Visual Acuity Right Eye Distance:   Left Eye Distance:   Bilateral Distance:    Right Eye Near:   Left Eye Near:    Bilateral Near:     Physical Exam Vitals and nursing note reviewed.  Constitutional:      General: He is not in acute distress.    Appearance: He is well-developed.  HENT:     Head: Normocephalic and atraumatic.  Eyes:     Conjunctiva/sclera: Conjunctivae normal.  Cardiovascular:     Rate and Rhythm: Normal rate and regular rhythm.     Heart sounds: No murmur heard. Pulmonary:     Effort: Pulmonary effort is normal. No tachypnea or respiratory distress.     Breath sounds: Examination of the left-upper field reveals decreased breath sounds. Examination of the left-middle field reveals decreased  breath sounds. Examination of the right-lower field reveals wheezing. Examination of the left-lower field reveals wheezing. Decreased breath sounds and wheezing present.  Abdominal:     Palpations: Abdomen is soft.     Tenderness: There is no abdominal tenderness.  Musculoskeletal:        General: No swelling.  Cervical back: Neck supple.  Skin:    General: Skin is warm and dry.     Capillary Refill: Capillary refill takes less than 2 seconds.  Neurological:     Mental Status: He is alert.  Psychiatric:        Mood and Affect: Mood normal.      UC Treatments / Results  Labs (all labs ordered are listed, but only abnormal results are displayed) Labs Reviewed - No data to display  EKG   Radiology DG Chest 2 View Result Date: 02/20/2024 EXAM: 2 VIEW(S) XRAY OF THE CHEST 02/20/2024 03:32:25 PM COMPARISON: 06/19/2023 CLINICAL HISTORY: Cough for 2 weeks. FINDINGS: LUNGS AND PLEURA: No focal pulmonary opacity. No pleural effusion. No pneumothorax. HEART AND MEDIASTINUM: No acute abnormality of the cardiac and mediastinal silhouettes. BONES AND SOFT TISSUES: Multilevel degenerative changes of thoracic spine. IMPRESSION: 1. No acute cardiopulmonary process. Electronically signed by: Elsie Gravely MD 02/20/2024 03:40 PM EST RP Workstation: [id number]    Procedures Procedures (including critical care time)  Medications Ordered in UC Medications - No data to display  Initial Impression / Assessment and Plan / UC Course  I have reviewed the triage vital signs and the nursing notes.  Pertinent labs & imaging results that were available during my care of the patient were reviewed by me and considered in my medical decision making (see chart for details).     Acute cough - Plan: DG Chest 2 View, DG Chest 2 View  Acute bronchitis, unspecified organism   Chest x-ray done today.  Final evaluation by the radiologist does not show any acute findings. Symptoms and physical exam  findings are most consistent with an acute bronchitis.  Given the severity and duration, we will treat this with antibiotics by mouth.  We will also use a stronger medication to help with the cough.  We recommend the following: Doxycycline  100 mg twice daily for 7 days.  This is an antibiotic.  Take this with food.  This medication can make you more sensitive to the sun so avoid prolonged sun exposure. Tussidex 5 mL every 12 hours as needed for cough.  Use caution as this medication can make you drowsy. Make sure to stay hydrated by drinking plenty of water. Return to urgent care or PCP if symptoms worsen or fail to resolve.    Final Clinical Impressions(s) / UC Diagnoses   Final diagnoses:  Acute cough  Acute bronchitis, unspecified organism     Discharge Instructions      Chest x-ray done today.  Final evaluation by the radiologist does not show any acute findings. Symptoms and physical exam findings are most consistent with an acute bronchitis.  Given the severity and duration, we will treat this with antibiotics by mouth.  We will also use a stronger medication to help with the cough.  We recommend the following: Doxycycline  100 mg twice daily for 7 days.  This is an antibiotic.  Take this with food.  This medication can make you more sensitive to the sun so avoid prolonged sun exposure. Tussidex 5 mL every 12 hours as needed for cough.  Use caution as this medication can make you drowsy. Make sure to stay hydrated by drinking plenty of water. Return to urgent care or PCP if symptoms worsen or fail to resolve.       ED Prescriptions   None    I have reviewed the PDMP during this encounter.    [1]  Social History Tobacco Use  Smoking status: Every Day    Current packs/day: 1.00    Average packs/day: 1 pack/day for 30.0 years (30.0 ttl pk-yrs)    Types: Cigarettes   Smokeless tobacco: Never  Vaping Use   Vaping status: Never Used  Substance Use Topics   Alcohol use: No    Drug use: No     Teresa Almarie LABOR, PA-C 02/20/24 1549  "

## 2024-02-20 NOTE — Progress Notes (Signed)
" °  Because of your ongoing symptoms of productive cough despite treatment provided on 02/15/2024 with Tessalon  and Prednisone , I feel your condition warrants further evaluation and I recommend that you be seen in a face-to-face visit.   NOTE: There will be NO CHARGE for this E-Visit   If you are having a true medical emergency, please call 911.     For an urgent face to face visit, [place] has multiple urgent care centers for your convenience.  Click the link below for the full list of locations and hours, walk-in wait times, appointment scheduling options and driving directions:  Urgent Care - [place], [place], [place], [place], [place], [place]  [place]     Your MyChart E-visit questionnaire answers were reviewed by a board certified advanced clinical practitioner to complete your personal care plan based on your specific symptoms.    Thank you for using e-Visits.    "

## 2024-02-20 NOTE — ED Triage Notes (Signed)
 Patient c/o sinus drainage and cough x 2 weeks.  Afebrile.  Patient did do e-visit x 1 week ago and given Prednisone  and Tessalon .  Last dose of prednisone  this am.  No better.

## 2024-02-20 NOTE — Discharge Instructions (Addendum)
 Chest x-ray done today.  Final evaluation by the radiologist does not show any acute findings. Symptoms and physical exam findings are most consistent with an acute bronchitis.  Given the severity and duration, we will treat this with antibiotics by mouth.  We will also use a stronger medication to help with the cough.  We recommend the following: Doxycycline  100 mg twice daily for 7 days.  This is an antibiotic.  Take this with food.  This medication can make you more sensitive to the sun so avoid prolonged sun exposure. Tussidex 5 mL every 12 hours as needed for cough.  Use caution as this medication can make you drowsy. Make sure to stay hydrated by drinking plenty of water. Return to urgent care or PCP if symptoms worsen or fail to resolve.

## 2024-02-24 LAB — OPHTHALMOLOGY REPORT-SCANNED

## 2024-02-29 ENCOUNTER — Ambulatory Visit
Admission: EM | Admit: 2024-02-29 | Discharge: 2024-02-29 | Disposition: A | Discharge status: Home / Self Care | Source: Home / Self Care | Attending: Family Medicine | Admitting: Family Medicine

## 2024-02-29 DIAGNOSIS — J069 Acute upper respiratory infection, unspecified: Secondary | ICD-10-CM | POA: Diagnosis not present

## 2024-02-29 DIAGNOSIS — R059 Cough, unspecified: Secondary | ICD-10-CM | POA: Diagnosis not present

## 2024-02-29 MED ORDER — AZITHROMYCIN 250 MG PO TABS: 250.0000 mg | ORAL_TABLET | Freq: Every day | ORAL | 0 refills | Status: AC

## 2024-02-29 MED ORDER — PREDNISONE 10 MG (21) PO TBPK: ORAL_TABLET | Freq: Every day | ORAL | 0 refills | Status: AC

## 2024-02-29 MED ORDER — METHYLPREDNISOLONE SODIUM SUCC 125 MG IJ SOLR
125.0000 mg | Freq: Once | INTRAMUSCULAR | Status: AC
  Administered 2024-02-29: 125 mg via INTRAMUSCULAR

## 2024-02-29 NOTE — Discharge Instructions (Addendum)
 Advised patient take medications as directed with food to completion.  Encouraged increase daily water intake to 64 ounces per day while taking these medications.  Advised if symptoms worsen and/or unresolved please follow-up with your PCP or here for further evaluation.

## 2024-02-29 NOTE — ED Triage Notes (Signed)
 Pt c/o continued cough and congestion x 3 weeks. HA since yesterday. Chest tightness starting today along with fatigue. Was seen last week and treated abx. Was also tx with prednisone  via e visit week before. Hx of bronchitis.

## 2024-02-29 NOTE — ED Provider Notes (Signed)
 " [facility]    CSN: [id number] Arrival date & time: 02/29/24  1546      History   Chief Complaint Chief Complaint  Patient presents with   Cough   Nasal Congestion   Headache   Shortness of Breath    HPI Jeremy Melton is a 59 y.o. male.   HPI Very pleasant 60 year old male presents with cough and congestion ongoing since 02/14/2024.  PMH significant for HTN, centrilobular emphysema, and T2DM associated mixed HLD.  Past Medical History:  Diagnosis Date   Bronchitis    Hypertension    Sleep apnea     Patient Active Problem List   Diagnosis Date Noted   Hyperlipidemia associated with type 2 diabetes mellitus (HCC) 09/29/2023   Leiomyoma of esophagus 09/29/2023   Controlled type 2 diabetes mellitus without complication, without long-term current use of insulin (HCC) 03/29/2023   Centrilobular emphysema (HCC) 04/12/2022   Aortic atherosclerosis 04/12/2022   Plantar fasciitis 03/19/2022   Anal fissure 10/06/2021   Grade III hemorrhoids 02/28/2019   Bleeding internal hemorrhoids 12/13/2018   Cigarette nicotine dependence, uncomplicated 06/11/2015   Insomnia 06/11/2015   Sleep apnea 09/06/2013   Bilateral foot pain 08/21/2013   Class 2 severe obesity due to excess calories with serious comorbidity and body mass index (BMI) of 35.0 to 35.9 in adult 04/18/2013   HTN (hypertension) 04/18/2013    Past Surgical History:  Procedure Laterality Date   CHOLECYSTECTOMY     NASAL SINUS SURGERY         Home Medications    Prior to Admission medications  Medication Sig Start Date End Date Taking? Authorizing Provider  azithromycin  (ZITHROMAX ) 250 MG tablet Take 1 tablet (250 mg total) by mouth daily. Take first 2 tablets together, then 1 every day until finished. 02/29/24  Yes Teddy Sharper, FNP  predniSONE  (STERAPRED UNI-PAK 21 TAB) 10 MG (21) TBPK tablet Take by mouth daily. Take 6 tabs by mouth daily  for 2 days, then 5 tabs for 2 days, then 4 tabs for 2  days, then 3 tabs for 2 days, 2 tabs for 2 days, then 1 tab by mouth daily for 2 days 02/29/24  Yes Teddy Sharper, FNP  Blood Glucose Monitoring Suppl DEVI 1 each by Does not apply route in the morning, at noon, and at bedtime. May substitute to any manufacturer covered by patient's insurance. 03/25/22   Willo Mini, NP  chlorpheniramine-HYDROcodone  (TUSSIONEX) 10-8 MG/5ML Take 5 mLs by mouth every 12 (twelve) hours as needed for cough. 02/20/24   Teresa Almarie LABOR, PA-C  EPINEPHrine  0.3 mg/0.3 mL IJ SOAJ injection Inject 0.3 mg into the muscle as needed for anaphylaxis. 03/19/22   Willo Mini, NP  lisinopril  (ZESTRIL ) 40 MG tablet Take 1 tablet (40 mg total) by mouth daily. 03/29/23   Willo Mini, NP  metFORMIN  (GLUCOPHAGE -XR) 500 MG 24 hr tablet TAKE 1 TABLET BY MOUTH EVERY DAY WITH BREAKFAST 12/21/23   Willo Mini, NP  metoprolol  succinate (TOPROL -XL) 100 MG 24 hr tablet Take 1 tablet (100 mg total) by mouth daily. 03/29/23   Willo Mini, NP  rosuvastatin  (CRESTOR ) 10 MG tablet Take 1 tablet (10 mg total) by mouth daily. 03/29/23   Willo Mini, NP  sildenafil  (REVATIO ) 20 MG tablet Take 1 tablet (20 mg total) by mouth daily as needed. 03/19/22   Willo Mini, NP    Family History Family History  Problem Relation Age of Onset   Hypertension Mother    Hypertension Father  Heart failure Father    Cancer Father        kidney   Hypertension Brother     Social History Social History[1]   Allergies   Bee venom, Loratadine, Allegra [fexofenadine hcl], and Zyrtec [cetirizine]   Review of Systems Review of Systems  HENT:  Positive for congestion.   Respiratory:  Positive for cough.   All other systems reviewed and are negative.    Physical Exam Triage Vital Signs ED Triage Vitals  Encounter Vitals Group     BP      Girls Systolic BP Percentile      Girls Diastolic BP Percentile      Boys Systolic BP Percentile      Boys Diastolic BP Percentile      Pulse      Resp      Temp       Temp src      SpO2      Weight      Height      Head Circumference      Peak Flow      Pain Score      Pain Loc      Pain Education      Exclude from Growth Chart    No data found.  Updated Vital Signs BP 112/79 (BP Location: Right Arm)   Pulse 72   Temp 98 F (36.7 C) (Oral)   Resp 18   SpO2 96%    Physical Exam Vitals and nursing note reviewed.  Constitutional:      Appearance: Normal appearance. He is obese. He is ill-appearing.  HENT:     Head: Normocephalic and atraumatic.     Right Ear: Tympanic membrane, ear canal and external ear normal.     Left Ear: Tympanic membrane, ear canal and external ear normal.     Mouth/Throat:     Mouth: Mucous membranes are moist.     Pharynx: Oropharynx is clear.  Eyes:     Extraocular Movements: Extraocular movements intact.     Conjunctiva/sclera: Conjunctivae normal.     Pupils: Pupils are equal, round, and reactive to light.  Cardiovascular:     Rate and Rhythm: Normal rate and regular rhythm.     Heart sounds: Normal heart sounds.  Pulmonary:     Effort: Pulmonary effort is normal.     Breath sounds: Normal breath sounds. No wheezing, rhonchi or rales.     Comments: Frequent nonproductive cough on exam Musculoskeletal:        General: Normal range of motion.  Skin:    General: Skin is warm and dry.  Neurological:     General: No focal deficit present.     Mental Status: He is alert and oriented to person, place, and time. Mental status is at baseline.  Psychiatric:        Mood and Affect: Mood normal.        Behavior: Behavior normal.      UC Treatments / Results  Labs (all labs ordered are listed, but only abnormal results are displayed) Labs Reviewed - No data to display  EKG   Radiology No results found.  Procedures Procedures (including critical care time)  Medications Ordered in UC Medications  methylPREDNISolone  sodium succinate (SOLU-MEDROL ) 125 mg/2 mL injection 125 mg (125 mg Intramuscular  Given 02/29/24 1610)    Initial Impression / Assessment and Plan / UC Course  I have reviewed the triage vital signs and the nursing notes.  Pertinent labs & imaging results that were available during my care of the patient were reviewed by me and considered in my medical decision making (see chart for details).     MDM: 1.  Acute URI-Rx'd Zithromax : Take as directed; 2.  Cough, unspecified type-IM Solu-Medrol  125 mg given once in clinic and prior to discharge, Rx'd Sterapred Unipak (42 tab 10 mg taper): Take as directed. Advised patient take medications as directed with food to completion.  Encouraged increase daily water intake to 64 ounces per day while taking these medications.  Advised if symptoms worsen and/or unresolved please follow-up with your PCP or here for further evaluation.  Patient discharged home, hemodynamically stable. Final Clinical Impressions(s) / UC Diagnoses   Final diagnoses:  Acute URI  Cough, unspecified type     Discharge Instructions      Advised patient take medications as directed with food to completion.  Encouraged increase daily water intake to 64 ounces per day while taking these medications.  Advised if symptoms worsen and/or unresolved please follow-up with your PCP or here for further evaluation.     ED Prescriptions     Medication Sig Dispense Auth. Provider   azithromycin  (ZITHROMAX ) 250 MG tablet Take 1 tablet (250 mg total) by mouth daily. Take first 2 tablets together, then 1 every day until finished. 6 tablet Rhyse Loux, FNP   predniSONE  (STERAPRED UNI-PAK 21 TAB) 10 MG (21) TBPK tablet Take by mouth daily. Take 6 tabs by mouth daily  for 2 days, then 5 tabs for 2 days, then 4 tabs for 2 days, then 3 tabs for 2 days, 2 tabs for 2 days, then 1 tab by mouth daily for 2 days 42 tablet Teddy Sharper, FNP      PDMP not reviewed this encounter.    [1]  Social History Tobacco Use   Smoking status: Every Day    Current packs/day: 1.00     Average packs/day: 1 pack/day for 30.0 years (30.0 ttl pk-yrs)    Types: Cigarettes   Smokeless tobacco: Never  Vaping Use   Vaping status: Never Used  Substance Use Topics   Alcohol use: No   Drug use: No     Teddy Sharper, FNP 02/29/24 1628  "

## 2024-03-29 ENCOUNTER — Ambulatory Visit: Admitting: Medical-Surgical
